# Patient Record
Sex: Male | Born: 1970 | Race: White | Hispanic: No | Marital: Single | Smoking: Former smoker
Health system: Southern US, Community
[De-identification: ages and names within clinical notes are randomized; demographics above are authoritative.]

## PROBLEM LIST (undated history)

## (undated) DIAGNOSIS — K219 Gastro-esophageal reflux disease without esophagitis: Secondary | ICD-10-CM

## (undated) HISTORY — PX: KNEE SURGERY: SHX244

## (undated) HISTORY — PX: THROAT SURGERY: SHX803

---

## 2005-11-02 ENCOUNTER — Emergency Department: Payer: Self-pay | Admitting: Emergency Medicine

## 2006-09-10 ENCOUNTER — Emergency Department: Payer: Self-pay | Admitting: Emergency Medicine

## 2006-11-02 ENCOUNTER — Emergency Department: Payer: Self-pay | Admitting: Emergency Medicine

## 2010-12-25 ENCOUNTER — Emergency Department (HOSPITAL_COMMUNITY)
Admission: EM | Admit: 2010-12-25 | Discharge: 2010-12-25 | Disposition: A | Discharge status: Home / Self Care | Payer: Self-pay | Attending: Emergency Medicine | Admitting: Emergency Medicine

## 2010-12-25 ENCOUNTER — Emergency Department (HOSPITAL_COMMUNITY): Payer: Self-pay

## 2010-12-25 DIAGNOSIS — M25529 Pain in unspecified elbow: Secondary | ICD-10-CM | POA: Insufficient documentation

## 2010-12-25 DIAGNOSIS — T07XXXA Unspecified multiple injuries, initial encounter: Secondary | ICD-10-CM | POA: Insufficient documentation

## 2010-12-25 DIAGNOSIS — W11XXXA Fall on and from ladder, initial encounter: Secondary | ICD-10-CM | POA: Insufficient documentation

## 2010-12-25 DIAGNOSIS — R109 Unspecified abdominal pain: Secondary | ICD-10-CM | POA: Insufficient documentation

## 2010-12-25 DIAGNOSIS — Y92009 Unspecified place in unspecified non-institutional (private) residence as the place of occurrence of the external cause: Secondary | ICD-10-CM | POA: Insufficient documentation

## 2010-12-25 DIAGNOSIS — R079 Chest pain, unspecified: Secondary | ICD-10-CM | POA: Insufficient documentation

## 2010-12-25 DIAGNOSIS — R042 Hemoptysis: Secondary | ICD-10-CM | POA: Insufficient documentation

## 2010-12-25 MED ORDER — IOHEXOL 300 MG/ML  SOLN
100.0000 mL | Freq: Once | INTRAMUSCULAR | Status: AC | PRN
  Administered 2010-12-25: 100 mL via INTRAVENOUS

## 2012-12-23 ENCOUNTER — Encounter (HOSPITAL_COMMUNITY): Payer: Self-pay | Admitting: Emergency Medicine

## 2012-12-23 ENCOUNTER — Emergency Department (HOSPITAL_COMMUNITY)
Admission: EM | Admit: 2012-12-23 | Discharge: 2012-12-23 | Disposition: A | Discharge status: Home / Self Care | Payer: Self-pay | Attending: Emergency Medicine | Admitting: Emergency Medicine

## 2012-12-23 ENCOUNTER — Emergency Department (HOSPITAL_COMMUNITY): Payer: Self-pay

## 2012-12-23 DIAGNOSIS — Y9389 Activity, other specified: Secondary | ICD-10-CM | POA: Insufficient documentation

## 2012-12-23 DIAGNOSIS — Y929 Unspecified place or not applicable: Secondary | ICD-10-CM | POA: Insufficient documentation

## 2012-12-23 DIAGNOSIS — W1789XA Other fall from one level to another, initial encounter: Secondary | ICD-10-CM | POA: Insufficient documentation

## 2012-12-23 DIAGNOSIS — F17200 Nicotine dependence, unspecified, uncomplicated: Secondary | ICD-10-CM | POA: Insufficient documentation

## 2012-12-23 DIAGNOSIS — S93409A Sprain of unspecified ligament of unspecified ankle, initial encounter: Secondary | ICD-10-CM | POA: Insufficient documentation

## 2012-12-23 DIAGNOSIS — Z79899 Other long term (current) drug therapy: Secondary | ICD-10-CM | POA: Insufficient documentation

## 2012-12-23 DIAGNOSIS — S93402A Sprain of unspecified ligament of left ankle, initial encounter: Secondary | ICD-10-CM

## 2012-12-23 MED ORDER — OXYCODONE-ACETAMINOPHEN 5-325 MG PO TABS
1.0000 | ORAL_TABLET | Freq: Once | ORAL | Status: AC
  Administered 2012-12-23: 1 via ORAL
  Filled 2012-12-23: qty 1

## 2012-12-23 MED ORDER — OXYCODONE-ACETAMINOPHEN 5-325 MG PO TABS: 1.0000 | ORAL_TABLET | ORAL | Status: DC | PRN

## 2012-12-23 NOTE — ED Provider Notes (Signed)
  CSN: [id number]     Arrival date & time 12/23/12  1120 History     First MD Initiated Contact with Patient 12/23/12 1134     Chief Complaint  Patient presents with  . Ankle Injury   (Consider location/radiation/quality/duration/timing/severity/associated sxs/prior Treatment) The history is provided by the patient.   Patient presents to the ED for left ankle pain since this morning. Patient states he was trying to install a door which was too large for the door frame. He tried to force the door and lost his balance, causing him to fall and twist his left ankle. Denies any head trauma or loss of consciousness. Now has pain and swelling to the lateral aspect of left ankle. Endorses some intermittent paresthesias of left foot.  Has taken OTC pain meds without significant relief.  No prior ankle injury.  History reviewed. No pertinent past medical history. History reviewed. No pertinent past surgical history. No family history on file. History  Substance Use Topics  . Smoking status: Current Every Day Smoker  . Smokeless tobacco: Not on file  . Alcohol Use: Yes    Review of Systems  Musculoskeletal: Positive for arthralgias.  All other systems reviewed and are negative.    Allergies  Review of patient's allergies indicates no known allergies.  Home Medications   Current Outpatient Rx  Name  Route  Sig  Dispense  Refill  . esomeprazole (NEXIUM) 20 MG capsule   Oral   Take 20 mg by mouth daily before breakfast.          BP 134/84  Pulse 80  Temp(Src) 97.9 F (36.6 C) (Oral)  Resp 22  SpO2 98% Physical Exam  Nursing note and vitals reviewed. Constitutional: He is oriented to person, place, and time. He appears well-developed and well-nourished.  HENT:  Head: Normocephalic and atraumatic.  Eyes: Conjunctivae and EOM are normal.  Neck: Normal range of motion. Neck supple.  Cardiovascular: Normal rate, regular rhythm and normal heart sounds.   Pulmonary/Chest: Effort  normal and breath sounds normal.  Musculoskeletal:       Left ankle: He exhibits decreased range of motion, swelling and ecchymosis. He exhibits no deformity, no laceration and normal pulse. Tenderness. Lateral malleolus tenderness found. Achilles tendon normal.       Feet:  Swelling, bruising, and TTP of lateral malleolus, some abrasion noted without active bleeding, strong distal pulse, sensation intact  Neurological: He is alert and oriented to person, place, and time.  Skin: Skin is warm and dry.  Psychiatric: He has a normal mood and affect.    ED Course   Procedures (including critical care time)  Labs Reviewed - No data to display Dg Ankle Complete Left  12/23/2012   *RADIOLOGY REPORT*  Clinical Data: Larey Seat, pain laterally  LEFT ANKLE COMPLETE - 3+ VIEW  Comparison: None  Findings: No fracture or dislocation.  Mild lateral soft tissue swelling.  Mortise intact.  Small joint effusion.  IMPRESSION: Sprain   Original Report Authenticated By: Esperanza Heir, M.D.   1. Ankle sprain, left, initial encounter     MDM   X-ray negative for acute fracture or dislocation-- Likely sprain. Ankle splint and crutches given. Rx Percocet. Continue following RICE routine at home for added relief.  Follow-up with orthopedics, Dr. Ophelia Charter.  Discussed plan with patient, he agreed. Return precautions advised.   Garlon Hatchet, PA-C 12/23/12 1346

## 2012-12-23 NOTE — ED Provider Notes (Signed)
Medical screening examination/treatment/procedure(s) were performed by non-physician practitioner and as supervising physician I was immediately available for consultation/collaboration.   Gwyneth Sprout, MD 12/23/12 2147

## 2012-12-23 NOTE — Progress Notes (Signed)
Orthopedic Tech Progress Note Patient Details:  Ralph Murphy [birth year] [id number]  Ortho Devices Type of Ortho Device: Ankle Air splint;Crutches Ortho Device/Splint Location: left ankle Ortho Device/Splint Interventions: Application   Hyun Reali 12/23/2012, 1:26 PM

## 2012-12-23 NOTE — ED Notes (Signed)
Turned left ankle while trying to install a door this am. Swelling and abrasion to left ankle.

## 2012-12-23 NOTE — ED Notes (Signed)
Patient transported to X-ray 

## 2012-12-23 NOTE — ED Notes (Signed)
Ice pack and elevation applied to left ankle. 

## 2013-01-07 ENCOUNTER — Ambulatory Visit: Payer: Self-pay

## 2018-02-11 ENCOUNTER — Other Ambulatory Visit: Payer: Self-pay

## 2018-02-11 ENCOUNTER — Emergency Department
Admission: EM | Admit: 2018-02-11 | Discharge: 2018-02-11 | Disposition: A | Discharge status: Home / Self Care | Payer: Self-pay | Attending: Emergency Medicine | Admitting: Emergency Medicine

## 2018-02-11 ENCOUNTER — Emergency Department: Payer: Self-pay

## 2018-02-11 ENCOUNTER — Encounter: Payer: Self-pay | Admitting: Emergency Medicine

## 2018-02-11 DIAGNOSIS — F17210 Nicotine dependence, cigarettes, uncomplicated: Secondary | ICD-10-CM | POA: Insufficient documentation

## 2018-02-11 DIAGNOSIS — R0602 Shortness of breath: Secondary | ICD-10-CM | POA: Insufficient documentation

## 2018-02-11 DIAGNOSIS — F1210 Cannabis abuse, uncomplicated: Secondary | ICD-10-CM | POA: Insufficient documentation

## 2018-02-11 DIAGNOSIS — R079 Chest pain, unspecified: Secondary | ICD-10-CM | POA: Insufficient documentation

## 2018-02-11 DIAGNOSIS — R61 Generalized hyperhidrosis: Secondary | ICD-10-CM | POA: Insufficient documentation

## 2018-02-11 HISTORY — DX: Gastro-esophageal reflux disease without esophagitis: K21.9

## 2018-02-11 LAB — BASIC METABOLIC PANEL
Anion gap: 9 (ref 5–15)
BUN: 15 mg/dL (ref 6–20)
CALCIUM: 8.8 mg/dL — AB (ref 8.9–10.3)
CO2: 26 mmol/L (ref 22–32)
CREATININE: 0.97 mg/dL (ref 0.61–1.24)
Chloride: 104 mmol/L (ref 98–111)
GFR calc Af Amer: >60 mL/min (ref >60)
GLUCOSE: 144 mg/dL — AB (ref 70–99)
Potassium: 3.8 mmol/L (ref 3.5–5.1)
Sodium: 139 mmol/L (ref 135–145)

## 2018-02-11 LAB — CBC
HCT: 44.9 % (ref 40.0–52.0)
Hemoglobin: 15.2 g/dL (ref 13.0–18.0)
MCH: 32.1 pg (ref 26.0–34.0)
MCHC: 34 g/dL (ref 32.0–36.0)
MCV: 94.5 fL (ref 80.0–100.0)
Platelets: 224 10*3/uL (ref 150–440)
RBC: 4.75 MIL/uL (ref 4.40–5.90)
RDW: 13.8 % (ref 11.5–14.5)
WBC: 7.6 10*3/uL (ref 3.8–10.6)

## 2018-02-11 LAB — TROPONIN I

## 2018-02-11 NOTE — ED Notes (Signed)
Pt discharged home after verbalizing understanding of discharge instructions; nad noted. 

## 2018-02-11 NOTE — ED Triage Notes (Signed)
Patient reports left-sided chest pain that started at 1030 this morning. Reports SOB and nausea during episode. Reports improvement of pain at this time. Patient denies history of similar episodes. Denies cardiac history.

## 2018-02-11 NOTE — Discharge Instructions (Signed)
Please establish a primary care physician who can do your normal health maintenance.  Please make a follow-up appointment with the cardiologist for further evaluation of your chest pain.  Return to the emergency department if you develop severe pain, lightheadedness or fainting, shortness of breath, palpitations, or any other symptoms concerning to you.

## 2018-02-11 NOTE — ED Provider Notes (Signed)
[facility] Emergency Department Provider Note  ____________________________________________  Time seen: Approximately 2:37 PM  I have reviewed the triage vital signs and the nursing notes.   HISTORY  Chief Complaint Chest Pain    HPI Ralph Murphy is a 47 y.o. male, with a history of GERD, presenting for chest pain.  The patient reports that he was standing at 10:30 AM today when he had 2 episodes of sharp chest pain on the left side that lasted several seconds each and resolve spontaneously.  These did not radiate, and he had no associated palpitations, lightheadedness or syncope.  He did have diaphoresis with nausea, and mild shortness of breath.  He has never experienced pains like this before but states that last week his chest "did not feel right."  He is unable to further characterize any symptoms but states that he has not been having any exertional chest pain.  He denies any lower extremity swelling or calf pain.  At this time, the patient remains symptom-free.  SH: Positive tobacco abuse FH: Does not know his mother; no history of early CAD in his father side.  Past Medical History:  Diagnosis Date  . GERD (gastroesophageal reflux disease)     There are no active problems to display for this patient.   Past Surgical History:  Procedure Laterality Date  . KNEE SURGERY    . THROAT SURGERY      Current Outpatient Rx  . Order #: [id number] Class: Historical Med  . Order #: [id number] Class: Print    Allergies Patient has no known allergies.  No family history on file.  Social History Social History   Tobacco Use  . Smoking status: Current Every Day Smoker    Packs/day: 1.00  . Smokeless tobacco: Never Used  Substance Use Topics  . Alcohol use: Not Currently    Comment: stopped 10/2017  . Drug use: Yes    Types: Marijuana    Comment: daily    Review of Systems Constitutional: No fever/chills.  No lightheadedness or syncope.  Positive  diaphoresis. Eyes: No visual changes. ENT: No sore throat. No congestion or rhinorrhea. Cardiovascular: Positive sharp left-sided chest pain. Denies palpitations. Respiratory: Positive shortness of breath.  No cough. Gastrointestinal: No abdominal pain.  No nausea, no vomiting.  No diarrhea.  No constipation. Genitourinary: Negative for dysuria. Musculoskeletal: Negative for back pain.  No lower extremity swelling or calf pain. Skin: Negative for rash. Neurological: Negative for headaches. No focal numbness, tingling or weakness.     ____________________________________________   PHYSICAL EXAM:  VITAL SIGNS: ED Triage Vitals  Enc Vitals Group     BP 02/11/18 1153 (!) 149/87     Pulse Rate 02/11/18 1153 72     Resp 02/11/18 1153 18     Temp 02/11/18 1153 98.4 F (36.9 C)     Temp Source 02/11/18 1153 Oral     SpO2 02/11/18 1153 97 %     Weight 02/11/18 1150 222 lb (100.7 kg)     Height 02/11/18 1150 6' (1.829 m)     Head Circumference --      Peak Flow --      Pain Score 02/11/18 1149 5     Pain Loc --      Pain Edu? --      Excl. in GC? --     Constitutional: Alert and oriented. Well appearing and in no acute distress. Answers questions appropriately. Eyes: Conjunctivae are normal.  EOMI. No scleral icterus. Head:  Atraumatic. Nose: No congestion/rhinnorhea. Mouth/Throat: Mucous membranes are moist.  Neck: No stridor.  Supple.  No JVD, no meningismus. Cardiovascular: Normal rate, regular rhythm. No murmurs, rubs or gallops.  Respiratory: Normal respiratory effort.  No accessory muscle use or retractions. Lungs CTAB.  No wheezes, rales or ronchi. Gastrointestinal: Soft, nontender and nondistended.  No guarding or rebound.  No peritoneal signs. Musculoskeletal: No LE edema.  Tenderness to palpation or palpable cords in the calves; negative Homans sign peer Neurologic:  A&Ox3.  Speech is clear.  Face and smile are symmetric.  EOMI.  Moves all extremities well. Skin:  Skin  is warm, dry and intact. No rash noted. Psychiatric: Mood and affect are normal. Speech and behavior are normal.  Normal judgement.  ____________________________________________   LABS (all labs ordered are listed, but only abnormal results are displayed)  Labs Reviewed  BASIC METABOLIC PANEL - Abnormal; Notable for the following components:      Result Value   Glucose, Bld 144 (*)    Calcium 8.8 (*)    All other components within normal limits  CBC  TROPONIN I  TROPONIN I   ____________________________________________  EKG  ED ECG REPORT I, Anne-Caroline Sharma Covert, the attending physician, personally viewed and interpreted this ECG.   Date: 02/11/2018  EKG Time: 1141  Rate: 65  Rhythm: normal sinus rhythm  Axis: leftward  Intervals:none  ST&T Change: No STEMI  ____________________________________________  RADIOLOGY  Dg Chest 2 View  Result Date: 02/11/2018 CLINICAL DATA:  Left-sided chest pain began at 10:30 this morning associated with shortness of breath and nausea. Symptoms have improved. Current smoker. EXAM: CHEST - 2 VIEW COMPARISON:  Chest x-ray of May 16, 2014 FINDINGS: The lungs are well-expanded. There is no focal infiltrate. There is no pleural effusion. The heart and mediastinal structures are normal. The trachea is midline. The bony thorax exhibits no acute abnormality. There is an old fracture of the posterior aspect of the right ninth rib. IMPRESSION: There is no active cardiopulmonary disease. Electronically Signed   By: David  [place] M.D.   On: 02/11/2018 12:20    ____________________________________________   PROCEDURES  Procedure(s) performed: None  Procedures  Critical Care performed: No ____________________________________________   INITIAL IMPRESSION / ASSESSMENT AND PLAN / ED COURSE  Pertinent labs & imaging results that were available during my care of the patient were reviewed by me and considered in my medical decision making (see  chart for details).  47 y.o. male, with a history of GERD, presenting with left-sided sharp chest pains associated with diaphoresis, shortness of breath and nausea without vomiting.  These chest pain episodes are atypical and were very brief in nature.  The patient has not been having any exertional chest pain and has no known history of hypertension, hyperlipidemia, diabetes, or prior coronary artery disease, although he does state he does not go to the doctor frequently.  The patient's EKG does not show any ischemic changes and his troponin is negative x1 although it was done only 1.5 hours after the initial event.  We will get a second troponin.  The patient's laboratory studies are otherwise reassuring with normal electrolytes, normal blood counts, and a chest x-ray that does not show any acute cardiopulmonary process.  Other possible etiologies include esophageal spasm.  PE or aortic pathology are considered but unlikely.  At this time, we will await the second troponin and if it is negative the patient will be discharged home.  I have I have already spoken to the  patient and his wife about undergoing a risk stratification study, as the patient has never had one, within the next couple of days.  ____________________________________________  FINAL CLINICAL IMPRESSION(S) / ED DIAGNOSES  Final diagnoses:  Chest pain, unspecified type  Shortness of breath  Diaphoresis         NEW MEDICATIONS STARTED DURING THIS VISIT:  New Prescriptions   No medications on file      Rockne Menghini, MD 02/11/18 1447

## 2018-02-13 ENCOUNTER — Other Ambulatory Visit: Payer: Self-pay | Admitting: Cardiology

## 2018-02-13 ENCOUNTER — Ambulatory Visit
Admission: RE | Admit: 2018-02-13 | Discharge: 2018-02-13 | Disposition: A | Discharge status: Home / Self Care | Payer: Self-pay | Source: Ambulatory Visit | Attending: Cardiology | Admitting: Cardiology

## 2018-02-13 DIAGNOSIS — R0602 Shortness of breath: Secondary | ICD-10-CM | POA: Insufficient documentation

## 2018-02-13 DIAGNOSIS — M79605 Pain in left leg: Secondary | ICD-10-CM

## 2018-02-13 DIAGNOSIS — M79662 Pain in left lower leg: Secondary | ICD-10-CM | POA: Insufficient documentation

## 2018-02-13 DIAGNOSIS — R079 Chest pain, unspecified: Secondary | ICD-10-CM | POA: Insufficient documentation

## 2021-01-04 ENCOUNTER — Emergency Department (HOSPITAL_COMMUNITY)
Admission: EM | Admit: 2021-01-04 | Discharge: 2021-01-04 | Disposition: A | Discharge status: Home / Self Care | Payer: Self-pay | Attending: Emergency Medicine | Admitting: Emergency Medicine

## 2021-01-04 ENCOUNTER — Emergency Department (HOSPITAL_COMMUNITY): Payer: Self-pay

## 2021-01-04 ENCOUNTER — Other Ambulatory Visit: Payer: Self-pay

## 2021-01-04 ENCOUNTER — Encounter (HOSPITAL_COMMUNITY): Payer: Self-pay | Admitting: Emergency Medicine

## 2021-01-04 DIAGNOSIS — Z23 Encounter for immunization: Secondary | ICD-10-CM | POA: Insufficient documentation

## 2021-01-04 DIAGNOSIS — S61211A Laceration without foreign body of left index finger without damage to nail, initial encounter: Secondary | ICD-10-CM | POA: Insufficient documentation

## 2021-01-04 DIAGNOSIS — W312XXA Contact with powered woodworking and forming machines, initial encounter: Secondary | ICD-10-CM | POA: Insufficient documentation

## 2021-01-04 DIAGNOSIS — F17210 Nicotine dependence, cigarettes, uncomplicated: Secondary | ICD-10-CM | POA: Insufficient documentation

## 2021-01-04 MED ORDER — LIDOCAINE HCL (PF) 1 % IJ SOLN
5.0000 mL | Freq: Once | INTRAMUSCULAR | Status: AC
  Administered 2021-01-04: 5 mL
  Filled 2021-01-04: qty 30

## 2021-01-04 MED ORDER — CEPHALEXIN 500 MG PO CAPS: 500.0000 mg | ORAL_CAPSULE | Freq: Two times a day (BID) | ORAL | 0 refills | Status: AC

## 2021-01-04 MED ORDER — ACETAMINOPHEN 500 MG PO TABS: 1000.0000 mg | ORAL_TABLET | Freq: Once | ORAL | Status: DC

## 2021-01-04 MED ORDER — TETANUS-DIPHTH-ACELL PERTUSSIS 5-2.5-18.5 LF-MCG/0.5 IM SUSY
0.5000 mL | PREFILLED_SYRINGE | Freq: Once | INTRAMUSCULAR | Status: AC
  Administered 2021-01-04: 0.5 mL via INTRAMUSCULAR
  Filled 2021-01-04: qty 0.5

## 2021-01-04 MED ORDER — OXYCODONE-ACETAMINOPHEN 5-325 MG PO TABS
1.0000 | ORAL_TABLET | Freq: Once | ORAL | Status: AC
Start: 2021-01-04 — End: 2021-01-04
  Administered 2021-01-04: 1 via ORAL
  Filled 2021-01-04: qty 1

## 2021-01-04 MED ORDER — POVIDONE-IODINE 10 % EX SOLN
CUTANEOUS | Status: AC
  Filled 2021-01-04: qty 15

## 2021-01-04 MED ORDER — OXYCODONE-ACETAMINOPHEN 5-325 MG PO TABS: 1.0000 | ORAL_TABLET | Freq: Three times a day (TID) | ORAL | 0 refills | Status: AC | PRN

## 2021-01-04 NOTE — Discharge Instructions (Addendum)
You have a laceration on your left index finger, you have received 10 sutures, please leave the area alone, for the first 24 hours after that I like you to rinse out the wound and change out the dressings 2-3 times daily.  I have started you on antibiotics please take as prescribed. I have given you a short course of narcotics please take as prescribed.  This medication can make you drowsy do not consume alcohol or operate heavy machinery when taking this medication.  This medication is Tylenol in it do not take Tylenol and take this medication.   Please come back to this department, urgent care, your PCP in the next 7 to 10 days to have your sutures removed.  Come back to the emergency department if you develop chest pain, shortness of breath, severe abdominal pain, uncontrolled nausea, vomiting, diarrhea.

## 2021-01-04 NOTE — ED Triage Notes (Signed)
Pt has left index finger laceration from a saw; bleeding controlled at this time

## 2021-01-04 NOTE — ED Notes (Signed)
Dressing applied. 

## 2021-01-04 NOTE — ED Provider Notes (Signed)
[facility] EMERGENCY DEPARTMENT Provider Note   CSN: [id number] Arrival date & time: 01/04/21  0962     History Chief Complaint  Patient presents with   Laceration    Ralph Murphy is a 50 y.o. male.  HPI  Patient with no significant medical history presents to the emergency department with chief complaint of left index finger laceration.  Patient states this happened a few hours ago, states he was outside cutting a piece of wood unfortunately the wood kicked back causing his hand to hit the edge of the saw.  He states he sustained a laceration on the dorsum and lateral aspect of the left index finger.  He states he was able to control the bleeding with direct pressure, he denies paresthesias or weakness in his finger, is able to move it without difficulty, he is not immunocompromise, is not on a blood thinner, does not remember his last tetanus shot.  He has no other complaints this time.  Does not endorse fevers, chills, chest pain, shortness of breath, abdominal pain.  Past Medical History:  Diagnosis Date   GERD (gastroesophageal reflux disease)     There are no problems to display for this patient.   Past Surgical History:  Procedure Laterality Date   KNEE SURGERY     THROAT SURGERY         History reviewed. No pertinent family history.  Social History   Tobacco Use   Smoking status: Every Day    Packs/day: 1.00    Types: Cigarettes   Smokeless tobacco: Never  Substance Use Topics   Alcohol use: Not Currently    Comment: stopped 10/2017   Drug use: Yes    Types: Marijuana    Comment: daily    Home Medications Prior to Admission medications   Medication Sig Start Date End Date Taking? Authorizing Provider  cephALEXin (KEFLEX) 500 MG capsule Take 1 capsule (500 mg total) by mouth 2 (two) times daily for 7 days. 01/04/21 01/11/21 Yes Carroll Sage, PA-C  oxyCODONE-acetaminophen (PERCOCET/ROXICET) 5-325 MG tablet Take 1 tablet by mouth every 8 (eight) hours as  needed for up to 3 days for severe pain. 01/04/21 01/07/21 Yes Carroll Sage, PA-C  esomeprazole (NEXIUM) 20 MG capsule Take 20 mg by mouth daily before breakfast.    [provider]    Allergies    Patient has no known allergies.  Review of Systems   Review of Systems  Constitutional:  Negative for chills and fever.  HENT:  Negative for congestion.   Respiratory:  Negative for shortness of breath.   Cardiovascular:  Negative for chest pain.  Gastrointestinal:  Negative for abdominal pain.  Musculoskeletal:  Negative for back pain.  Skin:  Positive for wound. Negative for rash.       Laceration to his left index finger.  Neurological:  Negative for dizziness.  Hematological:  Does not bruise/bleed easily.   Physical Exam Updated Vital Signs BP 132/78 (BP Location: Right Arm)   Pulse 70   Temp 98 F (36.7 C) (Oral)   Resp 18   Ht 6\' 1"  (1.854 m)   Wt 95.7 kg   SpO2 100%   BMI 27.84 kg/m   Physical Exam Vitals and nursing note reviewed.  Constitutional:      General: He is not in acute distress.    Appearance: He is not ill-appearing.  HENT:     Head: Normocephalic and atraumatic.     Nose: No congestion.  Eyes:  Conjunctiva/sclera: Conjunctivae normal.  Cardiovascular:     Rate and Rhythm: Normal rate and regular rhythm.     Pulses: Normal pulses.  Pulmonary:     Effort: Pulmonary effort is normal.  Musculoskeletal:     Comments: Left hand was visualized has a noted 6 cm laceration on the dorsum lateral aspect of the left index finger, hemodynamically stable, no surrounding erythema or edema, there is no noted ligament or tendon damage, no foreign bodies noted, he has full range of motion in all joints of his fingers, neurovascular fully intact.  Please see picture for full detail.  Skin:    General: Skin is warm and dry.  Neurological:     Mental Status: He is alert.  Psychiatric:        Mood and Affect: Mood normal.       ED Results /  Procedures / Treatments   Labs (all labs ordered are listed, but only abnormal results are displayed) Labs Reviewed - No data to display  EKG None  Radiology DG Finger Index Left  Result Date: 01/04/2021 CLINICAL DATA:  Index finger laceration. EXAM: LEFT INDEX FINGER 2+V COMPARISON:  None. FINDINGS: Cutaneous laceration of the index finger noted without foreign body or underlying fracture. No acute bony findings. IMPRESSION: 1. Cutaneous laceration of the left index finger. Electronically Signed   By: Gaylyn Rong M.D.   On: 01/04/2021 11:02    Procedures .Marland KitchenLaceration Repair  Date/Time: 01/04/2021 12:40 PM Performed by: Carroll Sage, PA-C Authorized by: Carroll Sage, PA-C   Consent:    Consent obtained:  Verbal   Consent given by:  Patient   Risks discussed:  Infection, pain, retained foreign body, need for additional repair, poor cosmetic result, tendon damage, vascular damage, poor wound healing and nerve damage   Alternatives discussed:  No treatment, delayed treatment, observation and referral Universal protocol:    Patient identity confirmed:  Verbally with patient Anesthesia:    Anesthesia method:  Nerve block   Block needle gauge:  24 G   Block anesthetic:  Lidocaine 1% w/o epi   Block injection procedure:  Introduced needle   Block outcome:  Anesthesia achieved Laceration details:    Location:  Finger   Finger location:  L index finger   Length (cm):  6   Depth (mm):  2 Pre-procedure details:    Preparation:  Patient was prepped and draped in usual sterile fashion and imaging obtained to evaluate for foreign bodies Exploration:    Limited defect created (wound extended): no     Hemostasis achieved with:  Direct pressure   Imaging obtained: x-ray     Imaging outcome: foreign body not noted     Wound exploration: wound explored through full range of motion     Contaminated: no   Treatment:    Area cleansed with:  Betadine   Amount of cleaning:   Standard   Visualized foreign bodies/material removed: no     Debridement:  None Skin repair:    Repair method:  Sutures   Suture size:  4-0   Suture material:  Prolene   Suture technique:  Simple interrupted   Number of sutures:  10 Approximation:    Approximation:  Close Post-procedure details:    Dressing:  Non-adherent dressing   Procedure completion:  Tolerated well, no immediate complications Comments:     After the procedure patient motor, sensation, strength were all intact.area was soft to the touch with good capillary refill.  No signs  of infection were noted, no rash, no ligament or tendon damage present.   Medications Ordered in ED Medications  povidone-iodine (BETADINE) 10 % external solution (has no administration in time range)  lidocaine (PF) (XYLOCAINE) 1 % injection 5 mL (5 mLs Infiltration Given 01/04/21 1133)  Tdap (BOOSTRIX) injection 0.5 mL (0.5 mLs Intramuscular Given 01/04/21 1134)  oxyCODONE-acetaminophen (PERCOCET/ROXICET) 5-325 MG per tablet 1 tablet (1 tablet Oral Given 01/04/21 1132)    ED Course  I have reviewed the triage vital signs and the nursing notes.  Pertinent labs & imaging results that were available during my care of the patient were reviewed by me and considered in my medical decision making (see chart for details).    MDM Rules/Calculators/A&P                          Initial impression-patient presents with a laceration to his left index finger.  He is alert, does not appear in acute stress, vital signs reassuring.  Will obtain imaging, update patient's tetanus shot, recommend suturing for improved wound healing.  Work-up-imaging of left index finger is negative for acute findings.  Reassessment Will recommend suturing to decrease infection risk and to assist with the healing process.  Patient was agreeable with this and tolerated the procedure well.  He received 10 sutures.  Neurovascular was fully intact after the procedure.  Rule  out-Low suspicion for fracture or dislocation as x-ray does not reveal any acute findings. low suspicion for ligament or tendon damage as area was palpated no gross defects noted, he had full range of motion in his left index finger.  Low suspicion for compartment syndrome as area was palpated it was soft to the touch, neurovascular fully intact before and after the procedure  Plan-  Laceration to left index finger-patient received 10 sutures, due to nature of wound will start him on antibiotics, he is up-to-date on his tetanus shot, have him come back 10 days for suture removal.  Vital signs have remained stable, no indication for hospital admission.    Patient given at home care as well strict return precautions.  Patient verbalized that they understood agreed to said plan.  Final Clinical Impression(s) / ED Diagnoses Final diagnoses:  Laceration of left index finger without foreign body without damage to nail, initial encounter    Rx / DC Orders ED Discharge Orders          Ordered    oxyCODONE-acetaminophen (PERCOCET/ROXICET) 5-325 MG tablet  Every 8 hours PRN        01/04/21 1232    cephALEXin (KEFLEX) 500 MG capsule  2 times daily        01/04/21 1232             Carroll Sage, PA-C 01/04/21 1247    Pollyann Savoy, MD 01/04/21 [phone]

## 2021-03-08 ENCOUNTER — Other Ambulatory Visit: Payer: Self-pay

## 2021-03-08 ENCOUNTER — Emergency Department
Admission: EM | Admit: 2021-03-08 | Discharge: 2021-03-08 | Disposition: A | Discharge status: Home / Self Care | Payer: Self-pay | Attending: Emergency Medicine | Admitting: Emergency Medicine

## 2021-03-08 ENCOUNTER — Emergency Department: Payer: Self-pay

## 2021-03-08 DIAGNOSIS — Y990 Civilian activity done for income or pay: Secondary | ICD-10-CM | POA: Insufficient documentation

## 2021-03-08 DIAGNOSIS — F17210 Nicotine dependence, cigarettes, uncomplicated: Secondary | ICD-10-CM | POA: Insufficient documentation

## 2021-03-08 DIAGNOSIS — S60451A Superficial foreign body of left index finger, initial encounter: Secondary | ICD-10-CM | POA: Insufficient documentation

## 2021-03-08 DIAGNOSIS — S62641B Nondisplaced fracture of proximal phalanx of left index finger, initial encounter for open fracture: Secondary | ICD-10-CM | POA: Insufficient documentation

## 2021-03-08 DIAGNOSIS — Y9269 Other specified industrial and construction area as the place of occurrence of the external cause: Secondary | ICD-10-CM | POA: Insufficient documentation

## 2021-03-08 DIAGNOSIS — W450XXA Nail entering through skin, initial encounter: Secondary | ICD-10-CM | POA: Insufficient documentation

## 2021-03-08 MED ORDER — CEPHALEXIN 500 MG PO CAPS: 500.0000 mg | ORAL_CAPSULE | Freq: Four times a day (QID) | ORAL | 0 refills | Status: AC

## 2021-03-08 MED ORDER — NAPROXEN 250 MG PO TABS: 500.0000 mg | ORAL_TABLET | Freq: Two times a day (BID) | ORAL | 0 refills | Status: AC

## 2021-03-08 MED ORDER — OXYCODONE HCL 5 MG PO TABS: 5.0000 mg | ORAL_TABLET | ORAL | 0 refills | Status: AC | PRN

## 2021-03-08 MED ORDER — HYDROMORPHONE HCL 1 MG/ML IJ SOLN
1.0000 mg | Freq: Once | INTRAMUSCULAR | Status: AC
  Administered 2021-03-08: 1 mg via INTRAVENOUS
  Filled 2021-03-08: qty 1

## 2021-03-08 MED ORDER — CEPHALEXIN 500 MG PO CAPS: 500.0000 mg | ORAL_CAPSULE | Freq: Four times a day (QID) | ORAL | 0 refills | Status: DC

## 2021-03-08 MED ORDER — LIDOCAINE HCL (PF) 1 % IJ SOLN
5.0000 mL | Freq: Once | INTRAMUSCULAR | Status: AC
  Administered 2021-03-08: 5 mL
  Filled 2021-03-08: qty 5

## 2021-03-08 MED ORDER — CEFAZOLIN SODIUM-DEXTROSE 1-4 GM/50ML-% IV SOLN
1.0000 g | Freq: Once | INTRAVENOUS | Status: AC
  Administered 2021-03-08: 1 g via INTRAVENOUS
  Filled 2021-03-08: qty 50

## 2021-03-08 MED ORDER — SULFAMETHOXAZOLE-TRIMETHOPRIM 800-160 MG PO TABS: 1.0000 | ORAL_TABLET | Freq: Two times a day (BID) | ORAL | 0 refills | Status: AC

## 2021-03-08 NOTE — Discharge Instructions (Addendum)
You have a fracture of the finger where the nail was embedded.  This puts you at risk of infection.  Please take the 2 antibiotics twice daily for the next 10 days.  Please follow-up with orthopedics in about 1 week.  If you develop redness swelling or pus draining from the wound, please return to the emergency department immediately.

## 2021-03-08 NOTE — ED Notes (Signed)
Ortho at bedside.

## 2021-03-08 NOTE — ED Provider Notes (Signed)
[facility]  ____________________________________________   Event Date/Time   First MD Initiated Contact with Patient 03/08/21 0915     (approximate)  I have reviewed the triage vital signs and the nursing notes.   HISTORY  Chief Complaint Foreign Body   HPI Ralph Murphy is a 50 y.o. male who presents with a nail embedded in his left second digit.  Onset was just prior to arrival.  Patient works with [employer] and was using a nail gun when it accidentally went into the proximal left second digit.  He endorses numbness distal and inability to straighten the digit.  Tetanus was several months ago.         Past Medical History:  Diagnosis Date   GERD (gastroesophageal reflux disease)     There are no problems to display for this patient.   Past Surgical History:  Procedure Laterality Date   KNEE SURGERY     THROAT SURGERY      Prior to Admission medications   Medication Sig Start Date End Date Taking? Authorizing Provider  cephALEXin (KEFLEX) 500 MG capsule Take 1 capsule (500 mg total) by mouth 4 (four) times daily for 7 days. 03/08/21 03/15/21 Yes Georga Hacking, MD  esomeprazole (NEXIUM) 20 MG capsule Take 20 mg by mouth daily before breakfast.   Yes [provider]    Allergies Patient has no known allergies.  No family history on file.  Social History Social History   Tobacco Use   Smoking status: Every Day    Packs/day: 1.00    Types: Cigarettes   Smokeless tobacco: Never  Substance Use Topics   Alcohol use: Not Currently    Comment: stopped 10/2017   Drug use: Yes    Types: Marijuana    Comment: daily    Review of Systems   Review of Systems  Musculoskeletal:  Positive for arthralgias.  Skin:  Positive for wound.  Neurological:  Positive for numbness. Negative for weakness.  All other systems reviewed and are negative.  Physical Exam Updated Vital Signs BP (!) 145/83 (BP Location: Right Arm)   Pulse  65   Resp 16   Ht 6\' 1"  (1.854 m)   Wt 98 kg   SpO2 96%   BMI 28.50 kg/m   Physical Exam Vitals and nursing note reviewed.  Constitutional:      General: He is not in acute distress.    Appearance: Normal appearance.  HENT:     Head: Normocephalic and atraumatic.  Eyes:     General: No scleral icterus.    Conjunctiva/sclera: Conjunctivae normal.  Pulmonary:     Effort: Pulmonary effort is normal. No respiratory distress.     Breath sounds: Normal breath sounds. No wheezing.  Musculoskeletal:        General: No deformity or signs of injury.     Cervical back: Normal range of motion.     Comments: Metal nail embedded in the proximal aspect of the proximal phalanx of the left second digit, does not go through and through, there is subjective decrease sensation to the distal phalanx, good cap refill, 2+ radial pulse  Skin:    Coloration: Skin is not jaundiced or pale.  Neurological:     General: No focal deficit present.     Mental Status: He is alert and oriented to person, place, and time. Mental status is at baseline.  Psychiatric:        Mood and Affect: Mood normal.  Behavior: Behavior normal.     LABS (all labs ordered are listed, but only abnormal results are displayed)  Labs Reviewed - No data to display ____________________________________________  EKG  N/a ____________________________________________  RADIOLOGY I, Randol Kern, personally viewed and evaluated these images (plain radiographs) as part of my medical decision making, as well as reviewing the written report by the radiologist.  ED MD interpretation: I reviewed the x-ray of the hand which demonstrates the nail in the proximal phalanx with an associated nondisplaced oblique fracture of the proximal phalanx    ____________________________________________   PROCEDURES  Procedure(s) performed (including Critical Care):  .Nerve Block  Date/Time: 03/08/2021 12:45 PM Performed by:  Georga Hacking, MD Authorized by: Georga Hacking, MD   Consent:    Consent obtained:  Verbal   Risks discussed:  Swelling and pain Universal protocol:    Patient identity confirmed:  Verbally with patient Indications:    Indications:  Pain relief and procedural anesthesia Location:    Body area:  Upper extremity   Laterality:  Left Skin anesthesia:    Skin anesthesia method:  None Procedure details:    Block needle gauge:  24 G   Anesthetic injected:  Lidocaine 1% w/o epi   Steroid injected:  None   Additive injected:  None   Injection procedure:  Anatomic landmarks identified Post-procedure details:    Outcome:  Anesthesia achieved   Procedure completion:  Tolerated Comments:     L index finger digital block   ____________________________________________   INITIAL IMPRESSION / ASSESSMENT AND PLAN / ED COURSE     50 year old male presents with a nailbed in his finger.  His tetanus is up-to-date.  On exam the nail is embedded in the proximal phalanx of the left second digit, does not go through and through.  Digital block was performed and I attempted to remove the nail with gentle traction however was unsuccessful.  Discussed with orthopedics who will come evaluate the patient.  He was given a dose of Ancef.  Dr. Martha Clan was able to remove the nail at bedside.  This was washed out thoroughly and dressed.  Will discharge with 7 days of Keflex.  Orthopedic follow-up in 1 week.  We discussed return precautions for signs of infection.      ____________________________________________   FINAL CLINICAL IMPRESSION(S) / ED DIAGNOSES  Final diagnoses:  Foreign body of left index finger  Open nondisplaced fracture of proximal phalanx of left index finger, initial encounter     ED Discharge Orders          Ordered    cephALEXin (KEFLEX) 500 MG capsule  4 times daily        03/08/21 1243             Note:  This document was prepared using Dragon voice  recognition software and may include unintentional dictation errors.    Georga Hacking, MD 03/08/21 1246

## 2021-03-08 NOTE — ED Triage Notes (Signed)
Pt to ED with nail to left hand from nail gun. Pt diaphoretic, dizzy, pale.

## 2021-03-08 NOTE — Consult Note (Signed)
ORTHOPAEDIC CONSULTATION  REQUESTING PHYSICIAN: Georga Hacking, MD  Chief Complaint: Nail gun injury to left index finger  HPI: Ralph Murphy is a 50 y.o. male presents to the ER after nail gun injury to his left index finger this morning.  Patient accidentally shot himself in the proximal phalanx of the left index finger with a nail.  He drove himself to the emergency department.  Patient underwent a digital block with attempted removal of the nail by the emergency room attending, Dr. Sidney Ace.  Orthopedics was consulted for assistance with nail removal after a failed attempt.  Dr. Debbe Mounts note states that the patient had an inability to straighten the digit upon arrival and had paresthesias in the distal aspect of the left index finger.  Patient is up-to-date on his tetanus.  He has received IV antibiotics in the emergency department.  Patient has received a digital block.    Patient's wife is at the bedside.  Past Medical History:  Diagnosis Date   GERD (gastroesophageal reflux disease)    Past Surgical History:  Procedure Laterality Date   KNEE SURGERY     THROAT SURGERY     Social History   Socioeconomic History   Marital status: Single    Spouse name: Not on file   Number of children: Not on file   Years of education: Not on file   Highest education level: Not on file  Occupational History   Not on file  Tobacco Use   Smoking status: Every Day    Packs/day: 1.00    Types: Cigarettes   Smokeless tobacco: Never  Substance and Sexual Activity   Alcohol use: Not Currently    Comment: stopped 10/2017   Drug use: Yes    Types: Marijuana    Comment: daily   Sexual activity: Yes  Other Topics Concern   Not on file  Social History Narrative   Not on file   Social Determinants of Health   Financial Resource Strain: Not on file  Food Insecurity: Not on file  Transportation Needs: Not on file  Physical Activity: Not on file  Stress: Not on file  Social Connections: Not  on file   No family history on file. No Known Allergies Prior to Admission medications   Medication Sig Start Date End Date Taking? Authorizing Provider  esomeprazole (NEXIUM) 20 MG capsule Take 20 mg by mouth daily before breakfast.   Yes [provider]  sulfamethoxazole-trimethoprim (BACTRIM DS) 800-160 MG tablet Take 1 tablet by mouth 2 (two) times daily for 10 days. 03/08/21 03/18/21 Yes Georga Hacking, MD  cephALEXin (KEFLEX) 500 MG capsule Take 1 capsule (500 mg total) by mouth 4 (four) times daily for 10 days. 03/08/21 03/18/21  Georga Hacking, MD   DG Hand Complete Left  Result Date: 03/08/2021 CLINICAL DATA:  Nail gun to left hand EXAM: LEFT HAND - COMPLETE 3+ VIEW COMPARISON:  Left index finger radiographs 01/04/2021 FINDINGS: There is a nail which obliquely penetrates through the base of the index finger metacarpal. There is no evidence of protrusion into the MCP joint on the lateral projection. Lucency within the bone adjacent to the nail seen on the frontal projection is consistent with nondisplaced fracture. There is no displaced fracture fragment. There is no other fracture. There is no erosive change. The joint spaces are preserved. Alignment is normal. IMPRESSION: Nail obliquely penetrating the base of the index finger metacarpal without evidence of intra-articular extension as above. Electronically Signed   By:  Lesia Hausen M.D.   On: 03/08/2021 09:58    Positive ROS: All other systems have been reviewed and were otherwise negative with the exception of those mentioned in the HPI and as above.  Physical Exam: General: Alert, no acute distress  MUSCULOSKELETAL: Left index finger: Patient has a single puncture wound over the dorsal radial aspect of the left proximal phalanx.  Patient's index finger remains well-perfused.  Patient has limited range of motion with the nail in place.  Patient has no erythema or fluctuance.  There is no osseous deformity seen.   Patient sensation is diminished due to digital block.  Neurologic: Patient has penetration of the proximal phalanx near the base from a nail without barbs.  There is no penetration into the MP joint.  Patient has a nondisplaced spiral fracture of the shaft of the proximal phalanx.  Assessment: Nail penetration into proximal phalanx of left index finger  Plan: Patient had a digital block.  I used pliers from the OR to remove the nail.  Compression was applied for bleeding.  I copiously irrigated the patient's left puncture wound.  I applied a dry sterile dressing.  A splint was applied to the undersurface of the finger to protect the fracture.  Patient be discharged on Bactrim and Keflex and will follow up in our office in 7 to 10 days for wound check and repeat x-ray of his proximal phalanx fracture.    Juanell Fairly, MD    03/08/2021 1:33 PM

## 2022-01-06 IMAGING — DX DG FINGER INDEX 2+V*L*
3 series · 3 of 3 positions shown · non-contrast
Comparison: None.

CLINICAL DATA: Index finger laceration.

EXAM:
LEFT INDEX FINGER 2+V

[finger ap]
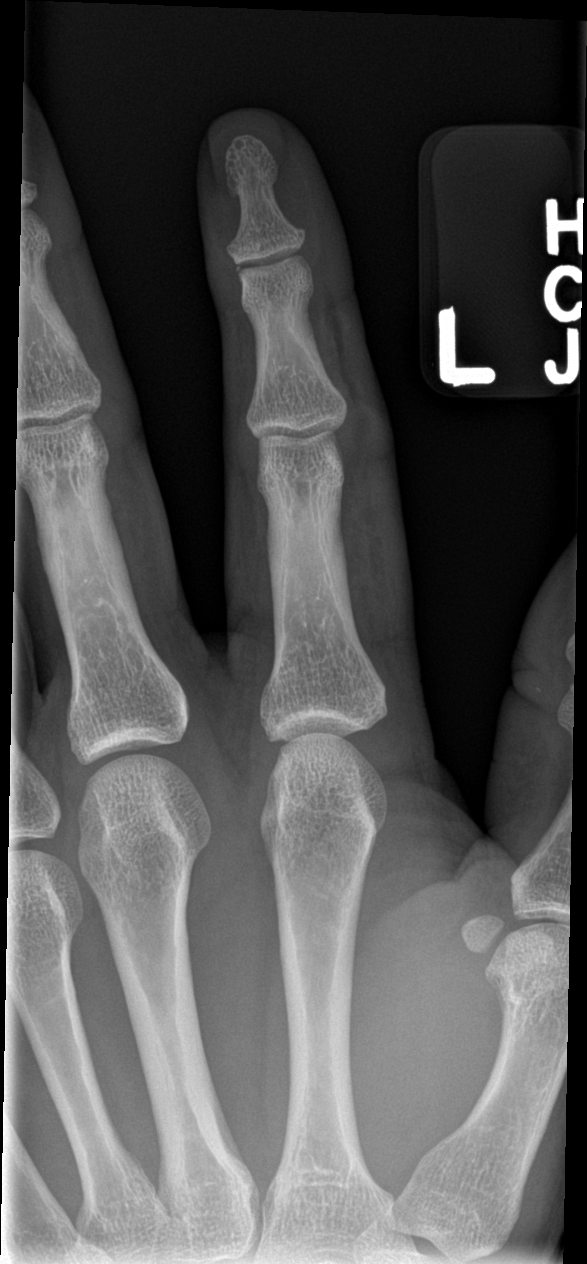

[finger obl]
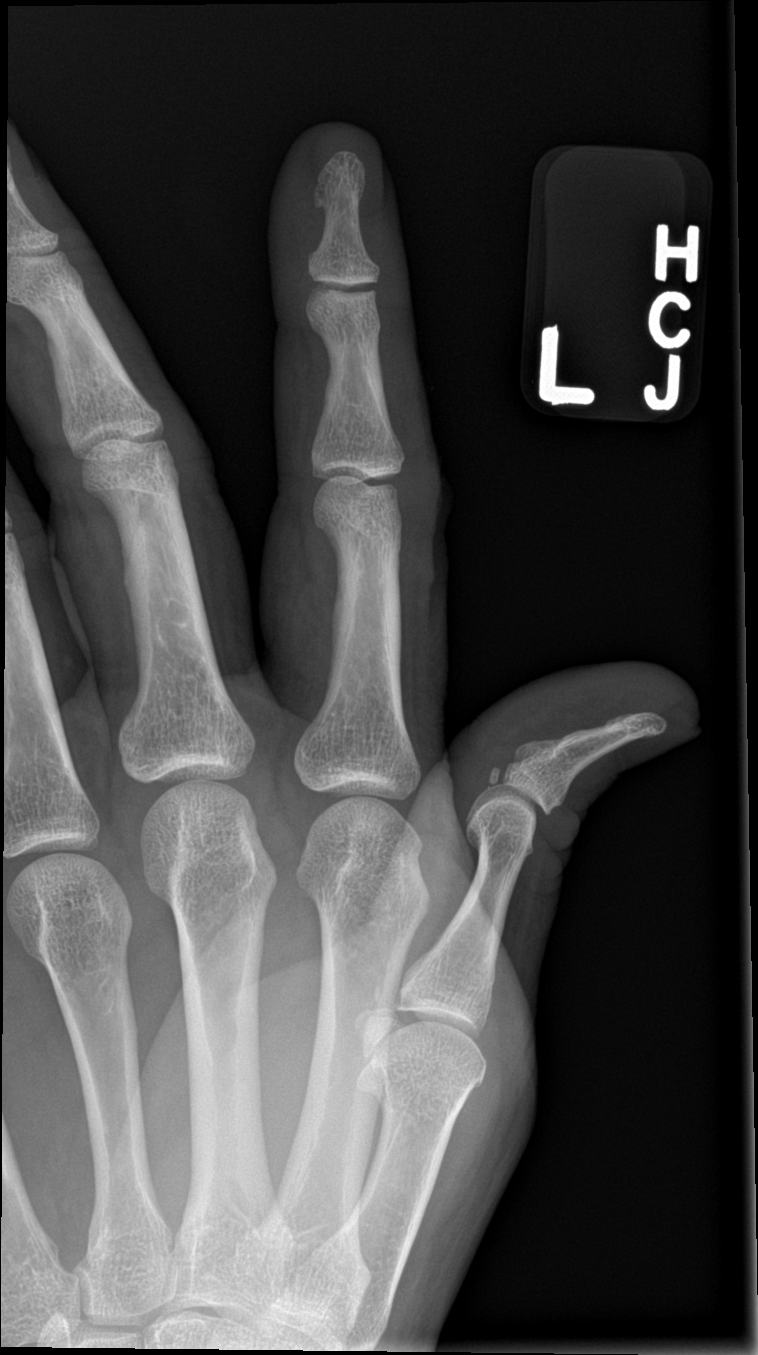

[finger lat]
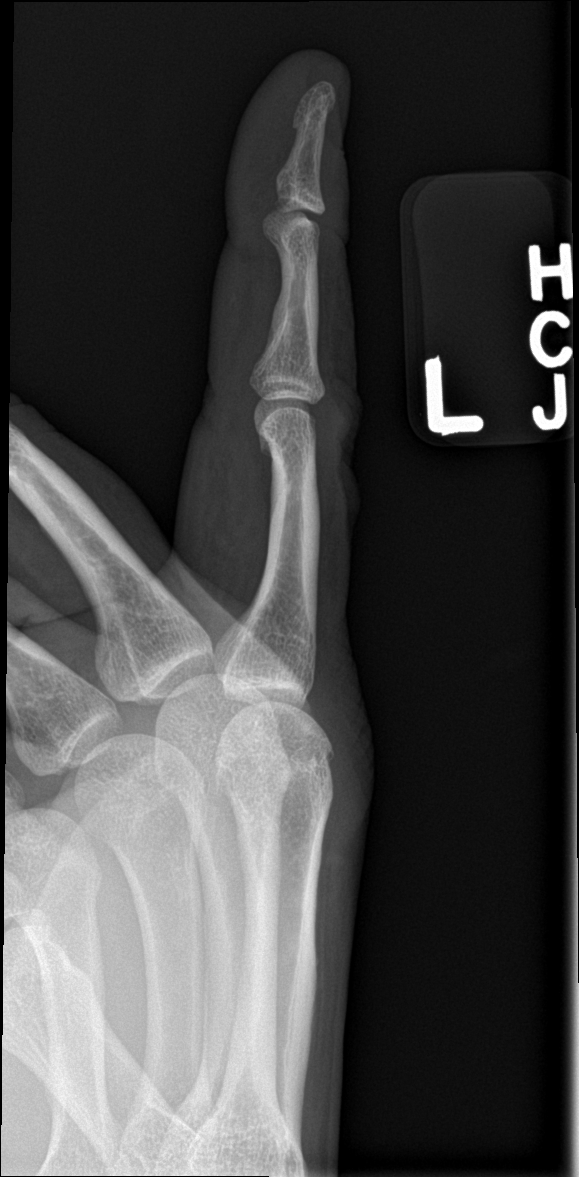

[3 of 3 positions shown; findings below may reference images not displayed]

FINDINGS: Cutaneous laceration of the index finger noted without foreign body
or underlying fracture. No acute bony findings.
IMPRESSION: 1. Cutaneous laceration of the left index finger.

## 2022-02-03 DIAGNOSIS — M1388 Other specified arthritis, other site: Secondary | ICD-10-CM | POA: Diagnosis not present

## 2022-02-05 ENCOUNTER — Emergency Department (HOSPITAL_COMMUNITY): Payer: BC Managed Care – PPO

## 2022-02-05 ENCOUNTER — Other Ambulatory Visit: Payer: Self-pay

## 2022-02-05 ENCOUNTER — Encounter (HOSPITAL_COMMUNITY): Payer: Self-pay

## 2022-02-05 ENCOUNTER — Emergency Department (HOSPITAL_COMMUNITY)
Admission: EM | Admit: 2022-02-05 | Discharge: 2022-02-05 | Disposition: A | Discharge status: Home / Self Care | Payer: BC Managed Care – PPO | Attending: Emergency Medicine | Admitting: Emergency Medicine

## 2022-02-05 DIAGNOSIS — S24109A Unspecified injury at unspecified level of thoracic spinal cord, initial encounter: Secondary | ICD-10-CM | POA: Diagnosis not present

## 2022-02-05 DIAGNOSIS — S233XXA Sprain of ligaments of thoracic spine, initial encounter: Secondary | ICD-10-CM | POA: Diagnosis not present

## 2022-02-05 DIAGNOSIS — M546 Pain in thoracic spine: Secondary | ICD-10-CM | POA: Diagnosis not present

## 2022-02-05 DIAGNOSIS — S299XXA Unspecified injury of thorax, initial encounter: Secondary | ICD-10-CM | POA: Diagnosis not present

## 2022-02-05 DIAGNOSIS — X500XXA Overexertion from strenuous movement or load, initial encounter: Secondary | ICD-10-CM | POA: Insufficient documentation

## 2022-02-05 DIAGNOSIS — S29019A Strain of muscle and tendon of unspecified wall of thorax, initial encounter: Secondary | ICD-10-CM

## 2022-02-05 MED ORDER — HYDROMORPHONE HCL 1 MG/ML IJ SOLN
0.5000 mg | Freq: Once | INTRAMUSCULAR | Status: AC
  Administered 2022-02-05: 0.5 mg via INTRAVENOUS
  Filled 2022-02-05: qty 0.5

## 2022-02-05 MED ORDER — ONDANSETRON HCL 4 MG/2ML IJ SOLN
4.0000 mg | Freq: Once | INTRAMUSCULAR | Status: AC
  Administered 2022-02-05: 4 mg via INTRAVENOUS
  Filled 2022-02-05: qty 2

## 2022-02-05 MED ORDER — PREDNISONE 10 MG PO TABS: ORAL_TABLET | ORAL | 0 refills | Status: DC

## 2022-02-05 MED ORDER — METHOCARBAMOL 500 MG PO TABS
500.0000 mg | ORAL_TABLET | Freq: Once | ORAL | Status: AC
  Administered 2022-02-05: 500 mg via ORAL
  Filled 2022-02-05: qty 1

## 2022-02-05 MED ORDER — OXYCODONE-ACETAMINOPHEN 5-325 MG PO TABS: 1.0000 | ORAL_TABLET | Freq: Four times a day (QID) | ORAL | 0 refills | Status: DC | PRN

## 2022-02-05 MED ORDER — DEXAMETHASONE SODIUM PHOSPHATE 10 MG/ML IJ SOLN
10.0000 mg | Freq: Once | INTRAMUSCULAR | Status: AC
  Administered 2022-02-05: 10 mg via INTRAVENOUS
  Filled 2022-02-05: qty 1

## 2022-02-05 NOTE — ED Triage Notes (Signed)
Pt c/o mid back pain.  Pt reports he was working on his lawnmower and "felt a pull."  Pain score 8/10.  Pt has not taken anything for pain.    Pt able to ambulate but difficulty standing and sitting.

## 2022-02-05 NOTE — ED Notes (Signed)
Patient transported to X-ray 

## 2022-02-05 NOTE — ED Provider Notes (Signed)
[facility] EMERGENCY DEPARTMENT Provider Note   CSN: [id number] Arrival date & time: 02/05/22  9326     History  Chief Complaint  Patient presents with   Back Pain    Ralph Murphy is a 51 y.o. male.  The history is provided by the patient.  Back Pain Location:  Thoracic spine Quality:  Stabbing Radiates to:  Does not radiate Pain severity:  Severe Pain is:  Same all the time Onset quality:  Sudden (was attempting to lift his lawnmower to a more stable position on its jack yesterday when he felt a sharp stabbing pain mid thoracic region.  worse today upon waking.) Progression:  Worsening Chronicity:  New Context: lifting heavy objects   Relieved by:  Nothing Worsened by:  Movement Ineffective treatments: borrowed family members percocet - this am, no relief. Associated symptoms: no abdominal pain, no numbness, no paresthesias, no tingling and no weakness        Home Medications Prior to Admission medications   Medication Sig Start Date End Date Taking? Authorizing Provider  oxyCODONE-acetaminophen (PERCOCET/ROXICET) 5-325 MG tablet Take 1 tablet by mouth every 6 (six) hours as needed. 02/05/22  Yes Littie Chiem, Raynelle Fanning, PA-C  predniSONE (DELTASONE) 10 MG tablet 6, 5, 4, 3, 2 then 1 tablet by mouth daily for 6 days total. 02/05/22  Yes Tamico Mundo, Raynelle Fanning, PA-C  esomeprazole (NEXIUM) 20 MG capsule Take 20 mg by mouth daily before breakfast.    [provider]      Allergies    Patient has no known allergies.    Review of Systems   Review of Systems  Gastrointestinal:  Negative for abdominal pain.  Musculoskeletal:  Positive for back pain.  Neurological:  Negative for tingling, weakness, numbness and paresthesias.    Physical Exam Updated Vital Signs BP 130/82   Pulse (!) 52   Temp 98.1 F (36.7 C)   Resp 18   SpO2 95%  Physical Exam Vitals and nursing note reviewed.  Constitutional:      Appearance: He is well-developed.  HENT:     Head: Normocephalic.  Eyes:      Conjunctiva/sclera: Conjunctivae normal.  Cardiovascular:     Rate and Rhythm: Normal rate.     Pulses: Normal pulses.     Comments: Pedal pulses normal. Pulmonary:     Effort: Pulmonary effort is normal.  Abdominal:     General: Bowel sounds are normal. There is no distension.     Palpations: Abdomen is soft. There is no mass.  Musculoskeletal:        General: Normal range of motion.     Cervical back: Normal range of motion and neck supple.     Thoracic back: Bony tenderness present.     Lumbar back: Tenderness present. No swelling, edema or spasms.     Comments: Point ttp mid thoracic spine. No palpable deformity, step offs, edema.   Skin:    General: Skin is warm and dry.  Neurological:     General: No focal deficit present.     Mental Status: He is alert and oriented to person, place, and time.     Sensory: No sensory deficit.     Motor: No tremor or atrophy.     Gait: Gait normal.     Comments: No strength deficit noted in hip and knee flexor and extensor muscle groups.  Ankle flexion and extension intact.     ED Results / Procedures / Treatments   Labs (all labs ordered are  listed, but only abnormal results are displayed) Labs Reviewed - No data to display  EKG None  Radiology DG Thoracic Spine 2 View  Result Date: 02/05/2022 CLINICAL DATA:  pain after lifting injury. EXAM: THORACIC SPINE 2 VIEWS COMPARISON:  None Available. FINDINGS: The alignment of the thoracic spine appears normal. The vertebral body heights and disc spaces are preserved. No signs of acute fracture. IMPRESSION: Negative. Electronically Signed   By: Signa Kell M.D.   On: 02/05/2022 10:35    Procedures Procedures    Medications Ordered in ED Medications  HYDROmorphone (DILAUDID) injection 0.5 mg (0.5 mg Intravenous Given 02/05/22 1040)  ondansetron (ZOFRAN) injection 4 mg (4 mg Intravenous Given 02/05/22 1039)  dexamethasone (DECADRON) injection 10 mg (10 mg Intravenous Given 02/05/22  1143)  HYDROmorphone (DILAUDID) injection 0.5 mg (0.5 mg Intravenous Given 02/05/22 1144)  methocarbamol (ROBAXIN) tablet 500 mg (500 mg Oral Given 02/05/22 1153)    ED Course/ Medical Decision Making/ A&P                           Medical Decision Making Pt with sudden onset mid thoracic back pain with lifting injury occurring yesterday.  Diff dx including thoracic compression fracture, herniated disk, muscle spasm, thoracic strain.   Amount and/or Complexity of Data Reviewed Radiology: ordered and independent interpretation performed.    Details: Normal, no compression fracture no plain film evidence suggesting reduced disc height.  Risk Prescription drug management.           Final Clinical Impression(s) / ED Diagnoses Final diagnoses:  Strain of thoracic region, initial encounter    Rx / DC Orders ED Discharge Orders          Ordered    oxyCODONE-acetaminophen (PERCOCET/ROXICET) 5-325 MG tablet  Every 6 hours PRN        02/05/22 1336    predniSONE (DELTASONE) 10 MG tablet        02/05/22 1336              Burgess Amor, PA-C 02/05/22 1340    Derwood Kaplan, MD 02/07/22 1223

## 2022-02-05 NOTE — Discharge Instructions (Signed)
Take medications as prescribed, do not drive within 4 hours of taking the oxycodone as this medication will make you drowsy.  Take your first dose of the prednisone tablets tomorrow as you have received today's dose of steroids through your IV line here.  Stay as active as you comfortably can avoiding any activity that worsens your pain.  You may benefit from ice packs to the site of pain throughout the next 2 days.  You may add a heating pad starting on Tuesday for 20 minutes several times daily.  Get rechecked for any persistent or worsening symptoms.

## 2022-02-05 NOTE — ED Notes (Signed)
Pt verbalized understanding of discharge instructions. Opportunity for questions provided.  

## 2022-08-10 ENCOUNTER — Encounter (HOSPITAL_COMMUNITY): Payer: Self-pay

## 2022-08-10 ENCOUNTER — Emergency Department (HOSPITAL_COMMUNITY)
Admission: EM | Admit: 2022-08-10 | Discharge: 2022-08-10 | Disposition: A | Discharge status: Home / Self Care | Payer: BC Managed Care – PPO | Attending: Emergency Medicine | Admitting: Emergency Medicine

## 2022-08-10 ENCOUNTER — Other Ambulatory Visit: Payer: Self-pay

## 2022-08-10 ENCOUNTER — Emergency Department (HOSPITAL_COMMUNITY): Payer: BC Managed Care – PPO

## 2022-08-10 DIAGNOSIS — S60222A Contusion of left hand, initial encounter: Secondary | ICD-10-CM

## 2022-08-10 DIAGNOSIS — S6992XA Unspecified injury of left wrist, hand and finger(s), initial encounter: Secondary | ICD-10-CM | POA: Diagnosis not present

## 2022-08-10 DIAGNOSIS — S60312A Abrasion of left thumb, initial encounter: Secondary | ICD-10-CM | POA: Insufficient documentation

## 2022-08-10 DIAGNOSIS — W231XXA Caught, crushed, jammed, or pinched between stationary objects, initial encounter: Secondary | ICD-10-CM | POA: Insufficient documentation

## 2022-08-10 DIAGNOSIS — M7989 Other specified soft tissue disorders: Secondary | ICD-10-CM | POA: Diagnosis not present

## 2022-08-10 MED ORDER — HYDROCODONE-ACETAMINOPHEN 5-325 MG PO TABS: 1.0000 | ORAL_TABLET | ORAL | 0 refills | Status: DC | PRN

## 2022-08-10 MED ORDER — HYDROCODONE-ACETAMINOPHEN 5-325 MG PO TABS
2.0000 | ORAL_TABLET | Freq: Once | ORAL | Status: AC
  Administered 2022-08-10: 2 via ORAL
  Filled 2022-08-10: qty 2

## 2022-08-10 NOTE — ED Provider Notes (Signed)
Ralph Murphy   CSN: [id number] Arrival date & time: 08/10/22  1648     History  Chief Complaint  Patient presents with   Hand Injury    Ralph Murphy is a 52 y.o. male.  Patient reports that a ladder slid closed crushing his left hand.  Patient complains of swelling and pain.  Patient denies any other areas of injury.  Patient complains of difficulty moving his hand.  The history is provided by the patient. No language interpreter was used.  Hand Injury Location:  Hand Hand location:  Dorsum of L hand Injury: yes   Mechanism of injury: crush   Crush injury:    Duration of crushing force:  3 hours Pain details:    Duration:  3 hours Dislocation: no   Foreign body present:  No foreign bodies Tetanus status:  Up to date Ineffective treatments:  None tried Risk factors: no known bone disorder        Home Medications Prior to Admission medications   Medication Sig Start Date End Date Taking? Authorizing Provider  esomeprazole (NEXIUM) 20 MG capsule Take 20 mg by mouth daily before breakfast.    [provider]  oxyCODONE-acetaminophen (PERCOCET/ROXICET) 5-325 MG tablet Take 1 tablet by mouth every 6 (six) hours as needed. 02/05/22   Idol, Almyra Free, PA-C  predniSONE (DELTASONE) 10 MG tablet 6, 5, 4, 3, 2 then 1 tablet by mouth daily for 6 days total. 02/05/22   Idol, Almyra Free, PA-C      Allergies    Patient has no known allergies.    Review of Systems   Review of Systems  Musculoskeletal:  Positive for myalgias.  All other systems reviewed and are negative.   Physical Exam Updated Vital Signs BP (!) 143/87 (BP Location: Right Arm)   Pulse 77   Temp 98.2 F (36.8 C) (Oral)   Resp 18   Ht '6\' 1"'$  (1.854 m)   Wt 99.8 kg   SpO2 98%   BMI 29.03 kg/m  Physical Exam Vitals reviewed.  Constitutional:      Appearance: Normal appearance.  Cardiovascular:     Rate and Rhythm: Normal rate.  Musculoskeletal:         General: Swelling and tenderness present.     Comments: Swollen tender left hand, abrasion base of left thumb dorsal aspect  nv and ns intact.  Decreased range of motion    Skin:    General: Skin is warm.  Neurological:     General: No focal deficit present.     Mental Status: He is alert.  Psychiatric:        Mood and Affect: Mood normal.     ED Results / Procedures / Treatments   Labs (all labs ordered are listed, but only abnormal results are displayed) Labs Reviewed - No data to display  EKG None  Radiology DG Hand Complete Left  Result Date: 08/10/2022 CLINICAL DATA:  Crush injury left hand EXAM: LEFT HAND - COMPLETE 3+ VIEW COMPARISON:  03/08/2021 FINDINGS: Frontal, oblique, and lateral views of the left hand are obtained. No acute fracture, subluxation, or dislocation. Joint spaces are well preserved. Soft tissue swelling along the thenar eminence and second digit. No radiopaque foreign body. IMPRESSION: 1. Soft tissue swelling.  No fracture or radiopaque foreign body. Electronically Signed   By: Randa Ngo M.D.   On: 08/10/2022 18:22    Procedures Procedures    Medications Ordered in ED  Medications  HYDROcodone-acetaminophen (NORCO/VICODIN) 5-325 MG per tablet 2 tablet (2 tablets Oral Given 08/10/22 1805)    ED Course/ Medical Decision Making/ A&P                             Medical Decision Making Pt complains of pain left hand after being cruched bya ladder   Amount and/or Complexity of Data Reviewed Radiology: ordered and independent interpretation performed. Decision-making details documented in ED Course.    Details: And shows no evidence of fracture  Risk Prescription drug management. Risk Details: Patient reports improved discomfort after pain medicine patient is placed in a thumb spica splint he is advised to follow-up with Dr. Rosey Bath orthopedic hand surgeon on-call.           Final Clinical Impression(s) / ED Diagnoses Final  diagnoses:  Contusion of left hand, initial encounter    Rx / DC Orders ED Discharge Orders          Ordered    HYDROcodone-acetaminophen (NORCO/VICODIN) 5-325 MG tablet  Every 4 hours PRN        08/10/22 1852           An After Visit Summary was printed and given to the patient.  An After Visit Summary was printed and given to the patient.    Sidney Ace 08/10/22 1854    Carmin Muskrat, MD 08/11/22 [phone]

## 2022-08-10 NOTE — ED Triage Notes (Signed)
Caught left hand in a ladder around 3 pm.

## 2022-08-10 NOTE — Discharge Instructions (Signed)
Return if any problems.  Follow up with Hand Surgery for evaltuion

## 2022-10-06 ENCOUNTER — Encounter: Payer: Self-pay | Admitting: Physician Assistant

## 2022-10-06 ENCOUNTER — Ambulatory Visit (INDEPENDENT_AMBULATORY_CARE_PROVIDER_SITE_OTHER): Payer: BC Managed Care – PPO | Admitting: Physician Assistant

## 2022-10-06 VITALS — BP 137/79 | HR 83 | Ht 73.0 in | Wt 223.0 lb

## 2022-10-06 DIAGNOSIS — Z1211 Encounter for screening for malignant neoplasm of colon: Secondary | ICD-10-CM

## 2022-10-06 DIAGNOSIS — Z720 Tobacco use: Secondary | ICD-10-CM | POA: Diagnosis not present

## 2022-10-06 DIAGNOSIS — G8929 Other chronic pain: Secondary | ICD-10-CM

## 2022-10-06 DIAGNOSIS — R739 Hyperglycemia, unspecified: Secondary | ICD-10-CM

## 2022-10-06 DIAGNOSIS — R200 Anesthesia of skin: Secondary | ICD-10-CM

## 2022-10-06 DIAGNOSIS — Z114 Encounter for screening for human immunodeficiency virus [HIV]: Secondary | ICD-10-CM

## 2022-10-06 DIAGNOSIS — M25511 Pain in right shoulder: Secondary | ICD-10-CM | POA: Diagnosis not present

## 2022-10-06 DIAGNOSIS — L989 Disorder of the skin and subcutaneous tissue, unspecified: Secondary | ICD-10-CM

## 2022-10-06 DIAGNOSIS — Z1159 Encounter for screening for other viral diseases: Secondary | ICD-10-CM

## 2022-10-06 DIAGNOSIS — Z800 Family history of malignant neoplasm of digestive organs: Secondary | ICD-10-CM

## 2022-10-06 NOTE — Progress Notes (Unsigned)
New patient visit   Patient: Ralph Murphy   DOB: August 12, 1970   52 y.o. Male  MRN: [id number] Visit Date: 10/06/2022  Today's healthcare provider: [name], PA-C   Chief Complaint  Patient presents with   New Patient (Initial Visit)    Dark spot-on the snow --tender, --2 years colonoscopy   Subjective    Ralph Murphy is a 52 y.o. male who presents today as a new patient to establish care.  HPI Pt has some concerns today:  His father was diagnosed with colon cancer at age 76-69, deceased from disease. He would like colon cancer screening.   History of a broken nose w/ darkened skin on his nose that has grown, additional similar looking dark spot on his right cheek.  Pt reports b/l finger/hand numbness, wakes from sleep.   Pt reports injury to right shoulder/collarbone and he feels the collarbone has moved in front of his sternum. Reports right shoulder pain daily.  H/o throat surgery-- swallowed metal needed removal. H/o smoking, quit 08/25/2018, but 1-3 packs a day for ~35 years.    Pt agreeable to shingles vaccine, but declines vaccines today.  Past Medical History:  Diagnosis Date   GERD (gastroesophageal reflux disease)    Past Surgical History:  Procedure Laterality Date   KNEE SURGERY     THROAT SURGERY     Family Status  Relation Name Status   Father  Deceased   Family History  Problem Relation Age of Onset   Colon cancer Father    Social History   Socioeconomic History   Marital status: Single    Spouse name: Not on file   Number of children: Not on file   Years of education: Not on file   Highest education level: Not on file  Occupational History   Not on file  Tobacco Use   Smoking status: Former    Packs/day: 1    Types: Cigarettes   Smokeless tobacco: Never   Tobacco comments:    Quit smoking ?   Substance and Sexual Activity   Alcohol use: Not Currently    Comment: stopped 10/2017   Drug use: Yes    Types: Marijuana    Comment:  daily   Sexual activity: Yes  Other Topics Concern   Not on file  Social History Narrative   Not on file   Social Determinants of Health   Financial Resource Strain: Not on file  Food Insecurity: Not on file  Transportation Needs: Not on file  Physical Activity: Not on file  Stress: Not on file  Social Connections: Not on file   Outpatient Medications Prior to Visit  Medication Sig   esomeprazole (NEXIUM) 20 MG capsule Take 20 mg by mouth daily before breakfast.   [DISCONTINUED] HYDROcodone-acetaminophen (NORCO/VICODIN) 5-325 MG tablet Take 1 tablet by mouth every 4 (four) hours as needed for moderate pain.   [DISCONTINUED] oxyCODONE-acetaminophen (PERCOCET/ROXICET) 5-325 MG tablet Take 1 tablet by mouth every 6 (six) hours as needed.   [DISCONTINUED] predniSONE (DELTASONE) 10 MG tablet 6, 5, 4, 3, 2 then 1 tablet by mouth daily for 6 days total.   No facility-administered medications prior to visit.   No Known Allergies  Immunization History  Administered Date(s) Administered   Tdap 01/04/2021    Health Maintenance  Topic Date Due   COVID-19 Vaccine (1) Never done   HIV Screening  Never done   Hepatitis C Screening  Never done   COLONOSCOPY (Pts 45-93yrs Insurance  coverage will need to be confirmed)  Never done   Lung Cancer Screening  Never done   Zoster Vaccines- Shingrix (1 of 2) Never done   INFLUENZA VACCINE  12/28/2022   DTaP/Tdap/Td (2 - Td or Tdap) 01/05/2031   HPV VACCINES  Aged Out    Patient Care Team: [name], PA-C as PCP - General (Physician Assistant)  Review of Systems  Constitutional:  Negative for fatigue and fever.  Respiratory:  Negative for cough and shortness of breath.   Cardiovascular:  Negative for chest pain, palpitations and leg swelling.  Genitourinary:  Positive for frequency.  Musculoskeletal:  Positive for arthralgias.  Neurological:  Positive for numbness. Negative for dizziness and headaches.     Objective    BP  137/79 (BP Location: Left Arm, Patient Position: Sitting, Cuff Size: Large)   Pulse 83   Ht 6\' 1"  (1.854 m)   Wt 223 lb (101.2 kg)   SpO2 96%   BMI 29.42 kg/m    Physical Exam Constitutional:      General: He is awake.     Appearance: He is well-developed.  HENT:     Head: Normocephalic.     Right Ear: Tympanic membrane, ear canal and external ear normal.     Left Ear: Tympanic membrane, ear canal and external ear normal.     Nose: Nose normal. No congestion or rhinorrhea.     Mouth/Throat:     Mouth: Mucous membranes are moist.     Pharynx: No oropharyngeal exudate or posterior oropharyngeal erythema.  Eyes:     Pupils: Pupils are equal, round, and reactive to light.  Cardiovascular:     Rate and Rhythm: Normal rate and regular rhythm.     Heart sounds: Normal heart sounds.  Pulmonary:     Effort: Pulmonary effort is normal.     Breath sounds: Normal breath sounds.  Chest:     Comments: R sternoclaviular joint does protrude more than the L Abdominal:     General: There is no distension.     Palpations: Abdomen is soft.     Tenderness: There is no abdominal tenderness. There is no guarding.  Musculoskeletal:     Cervical back: Normal range of motion.     Right lower leg: No edema.     Left lower leg: No edema.  Lymphadenopathy:     Cervical: No cervical adenopathy.  Skin:    General: Skin is warm.     Comments: Hyperpigmentation to bridge of nose and R cheek  Neurological:     Mental Status: He is alert and oriented to person, place, and time.  Psychiatric:        Attention and Perception: Attention normal.        Mood and Affect: Mood normal.        Speech: Speech normal.        Behavior: Behavior normal. Behavior is cooperative.     Depression Screen    10/06/2022    1:54 PM  PHQ 2/9 Scores  PHQ - 2 Score 0  PHQ- 9 Score 3   No results found for any visits on 10/06/22.  Assessment & Plan      Problem List Items Addressed This Visit       Other    Chronic right shoulder pain    Palpable abnormality R sternum. Referred to ortho      Relevant Orders   Ambulatory referral to Orthopedics   Tobacco use    Pack  year history at least 35 years. Quit 2020. Advised lung cancer screening, pt agreeable, referred.       Relevant Orders   Ambulatory Referral Lung Cancer Screening [facility]   Hand numbness    Carpal vs cubital tunnel. Advised how to treat both at home with braces/compression sleeves. If no improvement/relief would refer for EMG      Other Visit Diagnoses     Family history of colon cancer    -  Primary   Relevant Orders   Ambulatory referral to Gastroenterology   Colon cancer screening       Relevant Orders   Ambulatory referral to Gastroenterology   Skin lesion       Relevant Orders   Ambulatory referral to Dermatology   Hyperglycemia       Relevant Orders   CBC w/Diff/Platelet   Comprehensive Metabolic Panel (CMET)   HgB A1c   Lipid Profile   Encounter for hepatitis C screening test for low risk patient       Relevant Orders   Hepatitis C antibody   Encounter for screening for HIV       Relevant Orders   HIV antibody (with reflex)      6. Skin lesion Darkened skin on nose/R cheek. Likely sun related does not appear cancerous, but referring to derm.  - Ambulatory referral to Dermatology  7. Hyperglycemia Historically with check a1c - CBC w/Diff/Platelet - Comprehensive Metabolic Panel (CMET) - HgB A1c - Lipid Profile   Return in about 1 year (around 10/06/2023) for CPE; or earlier pending labs.     I, [name], PA-C have reviewed all documentation for this visit. The documentation on  10/06/22   for the exam, diagnosis, procedures, and orders are all accurate and complete.  [name], PA-C [facility] [address] [place], [place], [address] Office: [phone] Fax: [phone]   [facility]

## 2022-10-09 ENCOUNTER — Encounter: Payer: Self-pay | Admitting: Physician Assistant

## 2022-10-09 DIAGNOSIS — Z720 Tobacco use: Secondary | ICD-10-CM | POA: Insufficient documentation

## 2022-10-09 DIAGNOSIS — G8929 Other chronic pain: Secondary | ICD-10-CM | POA: Insufficient documentation

## 2022-10-09 DIAGNOSIS — R200 Anesthesia of skin: Secondary | ICD-10-CM | POA: Insufficient documentation

## 2022-10-09 NOTE — Assessment & Plan Note (Signed)
Pack year history at least 35 years. Quit 2020. Advised lung cancer screening, pt agreeable, referred.

## 2022-10-09 NOTE — Assessment & Plan Note (Signed)
Carpal vs cubital tunnel. Advised how to treat both at home with braces/compression sleeves. If no improvement/relief would refer for EMG

## 2022-10-09 NOTE — Assessment & Plan Note (Signed)
Palpable abnormality R sternum. Referred to ortho

## 2022-10-10 ENCOUNTER — Telehealth: Payer: Self-pay

## 2022-10-10 NOTE — Telephone Encounter (Signed)
Gastroenterology Pre-Procedure Review  Office visit has been scheduled to see Ralph Murphy on 10/20/22 at 8:15am. Colonoscopy to be scheduled during visit.  Request Date: TBD Requesting Physician: Dr. Jodelle Gross  PATIENT REVIEW QUESTIONS: The patient responded to the following health history questions as indicated:    1. Are you having any GI issues? yes (constipation, heartburn, pain in side) 2. Do you have a personal history of Polyps? no 3. Do you have a family history of Colon Cancer or Polyps? yes (father and cousin colorectal cancer) 4. Diabetes Mellitus? no 5. Joint replacements in the past 12 months?no 6. Major health problems in the past 3 months?no 7. Any artificial heart valves, MVP, or defibrillator?no    MEDICATIONS & ALLERGIES:    Patient reports the following regarding taking any anticoagulation/antiplatelet therapy:   Plavix, Coumadin, Eliquis, Xarelto, Lovenox, Pradaxa, Brilinta, or Effient? no Aspirin? no  Patient confirms/reports the following medications:  Current Outpatient Medications  Medication Sig Dispense Refill   esomeprazole (NEXIUM) 20 MG capsule Take 20 mg by mouth daily before breakfast.     No current facility-administered medications for this visit.    Patient confirms/reports the following allergies:  No Known Allergies  No orders of the defined types were placed in this encounter.   AUTHORIZATION INFORMATION Primary Insurance: 1D#: Group #:  Secondary Insurance: 1D#: Group #:  SCHEDULE INFORMATION: Date: TBD Time: Location: TBD

## 2022-10-13 DIAGNOSIS — Z1159 Encounter for screening for other viral diseases: Secondary | ICD-10-CM | POA: Diagnosis not present

## 2022-10-13 DIAGNOSIS — R739 Hyperglycemia, unspecified: Secondary | ICD-10-CM | POA: Diagnosis not present

## 2022-10-13 DIAGNOSIS — Z114 Encounter for screening for human immunodeficiency virus [HIV]: Secondary | ICD-10-CM | POA: Diagnosis not present

## 2022-10-14 LAB — CBC WITH DIFFERENTIAL/PLATELET
Basophils Absolute: 0 10*3/uL (ref 0.0–0.2)
Basos: 1 %
EOS (ABSOLUTE): 0.1 10*3/uL (ref 0.0–0.4)
Eos: 1 %
Hematocrit: 47.2 % (ref 37.5–51.0)
Hemoglobin: 15.7 g/dL (ref 13.0–17.7)
Immature Grans (Abs): 0 10*3/uL (ref 0.0–0.1)
Immature Granulocytes: 0 %
Lymphocytes Absolute: 1.6 10*3/uL (ref 0.7–3.1)
Lymphs: 29 %
MCH: 30.5 pg (ref 26.6–33.0)
MCHC: 33.3 g/dL (ref 31.5–35.7)
MCV: 92 fL (ref 79–97)
Monocytes Absolute: 0.5 10*3/uL (ref 0.1–0.9)
Monocytes: 9 %
Neutrophils Absolute: 3.5 10*3/uL (ref 1.4–7.0)
Neutrophils: 60 %
Platelets: 219 10*3/uL (ref 150–450)
RBC: 5.14 x10E6/uL (ref 4.14–5.80)
RDW: 12.7 % (ref 11.6–15.4)
WBC: 5.8 10*3/uL (ref 3.4–10.8)

## 2022-10-14 LAB — COMPREHENSIVE METABOLIC PANEL
ALT: 37 IU/L (ref 0–44)
AST: 23 IU/L (ref 0–40)
Albumin/Globulin Ratio: 2 (ref 1.2–2.2)
Albumin: 4.2 g/dL (ref 3.8–4.9)
Alkaline Phosphatase: 116 IU/L (ref 44–121)
BUN/Creatinine Ratio: 13 (ref 9–20)
BUN: 13 mg/dL (ref 6–24)
Bilirubin Total: 0.2 mg/dL (ref 0.0–1.2)
CO2: 23 mmol/L (ref 20–29)
Calcium: 9 mg/dL (ref 8.7–10.2)
Chloride: 102 mmol/L (ref 96–106)
Creatinine, Ser: 0.98 mg/dL (ref 0.76–1.27)
Globulin, Total: 2.1 g/dL (ref 1.5–4.5)
Glucose: 103 mg/dL — ABNORMAL HIGH (ref 70–99)
Potassium: 4.3 mmol/L (ref 3.5–5.2)
Sodium: 140 mmol/L (ref 134–144)
Total Protein: 6.3 g/dL (ref 6.0–8.5)
eGFR: 93 mL/min/{1.73_m2} (ref >59)

## 2022-10-14 LAB — LIPID PANEL
Chol/HDL Ratio: 4.6 ratio (ref 0.0–5.0)
Cholesterol, Total: 158 mg/dL (ref 100–199)
HDL: 34 mg/dL — ABNORMAL LOW (ref >39)
LDL Chol Calc (NIH): 88 mg/dL (ref 0–99)
Triglycerides: 211 mg/dL — ABNORMAL HIGH (ref 0–149)
VLDL Cholesterol Cal: 36 mg/dL (ref 5–40)

## 2022-10-14 LAB — HIV ANTIBODY (ROUTINE TESTING W REFLEX): HIV Screen 4th Generation wRfx: NONREACTIVE

## 2022-10-14 LAB — HEPATITIS C ANTIBODY: Hep C Virus Ab: NONREACTIVE

## 2022-10-14 LAB — HEMOGLOBIN A1C
Est. average glucose Bld gHb Est-mCnc: 123 mg/dL
Hgb A1c MFr Bld: 5.9 % — ABNORMAL HIGH (ref 4.8–5.6)

## 2022-10-19 NOTE — Progress Notes (Signed)
Ralph Amy, PA-C [address]  [address]  [place], [place] [address]  Main: [phone]  Fax: [phone]   Gastroenterology Consultation  Referring Provider:     [name], PA-C Primary Care Physician:  [name], PA-C Primary Gastroenterologist:  Ralph Amy, PA-C / Dr. Wyline Mood  Reason for Consultation:     Side Pain, Constipation, GERD, Fam Hx Colon Cancer        HPI:   Ralph Murphy is a 52 y.o. y/o male referred for consultation & management  by [name], PA-C.    He has been having intermittent left lower quadrant and left side pain for 10 years.  Has had worsening LLQ pain in the past few weeks.  Worse after he eats.  Relieved after passing gas and having a bowel movement.  Currently has LLQ pain 5 days/week.  Pain is 7 out of 10.  He denies rectal bleeding or unintentional weight loss.  Has chronic constipation.  Taking Metamucil and OTC stool softener which helps, yet not controlled.  He reports having acid reflux for many years.  Has been on Nexium for many years.  Currently taking Nexium 20 mg daily.  Occasional breakthrough heartburn.  Denies dysphagia.  No family history of esophageal or stomach cancer.  His Father Dx with Colon Cancer age 8, deceased.   Pt. Has never had a Colonoscopy.  Labs 10/13/22: Negative HCV and HIV; Normal CBC (Hgb 15.7g); Normal CMP.  Last CT abd/pelvis in 2012: No Acute abnormality.  No recent abdominal Imaging.   Past Medical History:  Diagnosis Date   GERD (gastroesophageal reflux disease)     Past Surgical History:  Procedure Laterality Date   KNEE SURGERY     THROAT SURGERY      Prior to Admission medications   Medication Sig Start Date End Date Taking? Authorizing Provider  esomeprazole (NEXIUM) 20 MG capsule Take 20 mg by mouth daily before breakfast.    [provider]    Family History  Problem Relation Age of Onset   Colon cancer Father      Social History   Tobacco Use    Smoking status: Former    Packs/day: 1    Types: Cigarettes   Smokeless tobacco: Never   Tobacco comments:    Quit smoking ?   Substance Use Topics   Alcohol use: Not Currently    Comment: stopped 10/2017   Drug use: Yes    Types: Marijuana    Comment: daily    Allergies as of 10/20/2022   (No Known Allergies)    Review of Systems:    All systems reviewed and negative except where noted in HPI.   Physical Exam:  There were no vitals taken for this visit. No LMP for male patient. Psych:  Alert and cooperative. Normal mood and affect. General:   Alert,  Well-developed, well-nourished, pleasant and cooperative in NAD Head:  Normocephalic and atraumatic. Eyes:  Sclera clear, no icterus.   Conjunctiva pink. Lungs:  Respirations even and unlabored.  Clear throughout to auscultation.   No wheezes, crackles, or rhonchi. No acute distress. Heart:  Regular rate and rhythm; no murmurs, clicks, rubs, or gallops. Abdomen:  Normal bowel sounds.  No bruits.  Soft, and non-distended without masses, hepatosplenomegaly or hernias noted.  Moderate LLQ Tenderness; Mild RLQ Tenderness; No Upper abdominal tenderness.  No guarding or rebound tenderness.    Neurologic:  Alert and oriented x3;  grossly normal neurologically. Psych:  Alert  and cooperative. Normal mood and affect.  Imaging Studies: No results found.  Assessment and Plan:   ALIKAI PIGNATELLI is a 52 y.o. y/o male has been referred for Multiple GI Symptoms.  No previous EGD or Colonsocopy.  He is due for his first screening Colonoscopy due to age.  Side Pain - LLQ, Left Side Abdominal Pain  CT abd / pelvis with Contrast  Rule Out Diverticulitis - Moderate tenderness on exam today.  CT is negative, then treat constipation and proceed with colonoscopy.  Constipation  Continue Metamucil daily.  Add prescription Linzess 145 mcg 1 capsule once daily.  Prescription sent.  High-fiber diet.  64 ounces of water daily.  GERD  Scheduling EGD I  discussed risks of EGD with patient to include risk of bleeding, perforation, and risk of sedation.   Patient expressed understanding and agrees to proceed with EGD.   Continue Nexium 20 mg once daily.  Add OTC Pepcid 20 mg once or twice daily.  Recommend Lifestyle Modifications to prevent Acid Reflux.  Rec. Avoid coffee, sodas, peppermint, citrus fruits, and spicey foods.  Avoid eating 2-3 hours before bedtime.   Family History of Colon Cancer - Father Age 60  Scheduling Colonoscopy I discussed risks of colonoscopy with patient to include risk of bleeding, colon perforation, and risk of sedation.  Patient expressed understanding and agrees to proceed with colonoscopy.   Colon Cancer Screening   Scheduling Colonoscopy.   Follow up in 4 weeks after EGD / Colon with TG.  Ralph Amy, PA-C

## 2022-10-20 ENCOUNTER — Ambulatory Visit (INDEPENDENT_AMBULATORY_CARE_PROVIDER_SITE_OTHER): Payer: BC Managed Care – PPO | Admitting: Physician Assistant

## 2022-10-20 ENCOUNTER — Other Ambulatory Visit: Payer: Self-pay

## 2022-10-20 ENCOUNTER — Encounter: Payer: Self-pay | Admitting: Physician Assistant

## 2022-10-20 VITALS — BP 132/79 | HR 67 | Temp 98.5°F | Ht 73.0 in | Wt 225.8 lb

## 2022-10-20 DIAGNOSIS — K5909 Other constipation: Secondary | ICD-10-CM | POA: Diagnosis not present

## 2022-10-20 DIAGNOSIS — Z800 Family history of malignant neoplasm of digestive organs: Secondary | ICD-10-CM

## 2022-10-20 DIAGNOSIS — K219 Gastro-esophageal reflux disease without esophagitis: Secondary | ICD-10-CM

## 2022-10-20 DIAGNOSIS — R1032 Left lower quadrant pain: Secondary | ICD-10-CM | POA: Diagnosis not present

## 2022-10-20 DIAGNOSIS — R109 Unspecified abdominal pain: Secondary | ICD-10-CM

## 2022-10-20 DIAGNOSIS — Z1211 Encounter for screening for malignant neoplasm of colon: Secondary | ICD-10-CM

## 2022-10-20 MED ORDER — PEG 3350-KCL-NA BICARB-NACL 420 G PO SOLR: 4000.0000 mL | Freq: Once | ORAL | 0 refills | Status: AC

## 2022-10-20 MED ORDER — LINACLOTIDE 145 MCG PO CAPS
145.0000 ug | ORAL_CAPSULE | Freq: Every day | ORAL | Status: AC
Start: 2022-10-21 — End: 2023-01-19

## 2022-10-20 NOTE — Patient Instructions (Addendum)
CT abdomen and pelvis with contrast scheduled 5/31/@ 12:15 @ [facility].  [address]  [place]                 Constipation, Adult Constipation is when a person has fewer than three bowel movements in a week, has difficulty having a bowel movement, or has stools (feces) that are dry, hard, or larger than normal. Constipation may be caused by an underlying condition. It may become worse with age if a person takes certain medicines and does not take in enough fluids. Follow these instructions at home: Eating and drinking  Eat foods that have a lot of fiber, such as beans, whole grains, and fresh fruits and vegetables. Limit foods that are low in fiber and high in fat and processed sugars, such as fried or sweet foods. These include french fries, hamburgers, cookies, candies, and soda. Drink enough fluid to keep your urine pale yellow. General instructions Exercise regularly or as told by your health care provider. Try to do 150 minutes of moderate exercise each week. Use the bathroom when you have the urge to go. Do not hold it in. Take over-the-counter and prescription medicines only as told by your health care provider. This includes any fiber supplements. During bowel movements: Practice deep breathing while relaxing the lower abdomen. Practice pelvic floor relaxation. Watch your condition for any changes. Let your health care provider know about them. Keep all follow-up visits as told by your health care provider. This is important. Contact a health care provider if: You have pain that gets worse. You have a fever. You do not have a bowel movement after 4 days. You vomit. You are not hungry or you lose weight. You are bleeding from the opening between the buttocks (anus). You have thin, pencil-like stools. Get help right away if: You have a fever and your symptoms suddenly get worse. You leak stool or have blood in your stool. Your abdomen is  bloated. You have severe pain in your abdomen. You feel dizzy or you faint. Summary Constipation is when a person has fewer than three bowel movements in a week, has difficulty having a bowel movement, or has stools (feces) that are dry, hard, or larger than normal. Eat foods that have a lot of fiber, such as beans, whole grains, and fresh fruits and vegetables. Drink enough fluid to keep your urine pale yellow. Take over-the-counter and prescription medicines only as told by your health care provider. This includes any fiber supplements. This information is not intended to replace advice given to you by your health care provider. Make sure you discuss any questions you have with your health care provider. Document Revised: 03/29/2022 Document Reviewed: 03/29/2022 Elsevier Patient Education  2024 [employer] Food Choices for Gastroesophageal Reflux Disease, Adult When you have gastroesophageal reflux disease (GERD), the foods you eat and your eating habits are very important. Choosing the right foods can help ease the discomfort of GERD. Consider working with a dietitian to help you make healthy food choices. What are tips for following this plan? Reading food labels Look for foods that are low in saturated fat. Foods that have less than 5% of daily value (DV) of fat and 0 g of trans fats may help with your symptoms. Cooking Cook foods using methods other than frying. This may include baking, steaming, grilling, or broiling. These are all methods that do not need a lot of fat for cooking. To add flavor, try to use herbs that  are low in spice and acidity. Meal planning  Choose healthy foods that are low in fat, such as fruits, vegetables, whole grains, low-fat dairy products, lean meats, fish, and poultry. Eat frequent, small meals instead of three large meals each day. Eat your meals slowly, in a relaxed setting. Avoid bending over or lying down until 2-3 hours after eating. Limit high-fat  foods such as fatty meats or fried foods. Limit your intake of fatty foods, such as oils, butter, and shortening. Avoid the following as told by your health care provider: Foods that cause symptoms. These may be different for different people. Keep a food diary to keep track of foods that cause symptoms. Alcohol. Drinking large amounts of liquid with meals. Eating meals during the 2-3 hours before bed. Lifestyle Maintain a healthy weight. Ask your health care provider what weight is healthy for you. If you need to lose weight, work with your health care provider to do so safely. Exercise for at least 30 minutes on 5 or more days each week, or as told by your health care provider. Avoid wearing clothes that fit tightly around your waist and chest. Do not use any products that contain nicotine or tobacco. These products include cigarettes, chewing tobacco, and vaping devices, such as e-cigarettes. If you need help quitting, ask your health care provider. Sleep with the head of your bed raised. Use a wedge under the mattress or blocks under the bed frame to raise the head of the bed. Chew sugar-free gum after mealtimes. What foods should I eat?  Eat a healthy, well-balanced diet of fruits, vegetables, whole grains, low-fat dairy products, lean meats, fish, and poultry. Each person is different. Foods that may trigger symptoms in one person may not trigger any symptoms in another person. Work with your health care provider to identify foods that are safe for you. The items listed above may not be a complete list of recommended foods and beverages. Contact a dietitian for more information. What foods should I avoid? Limiting some of these foods may help manage the symptoms of GERD. Everyone is different. Consult a dietitian or your health care provider to help you identify the exact foods to avoid, if any. Fruits Any fruits prepared with added fat. Any fruits that cause symptoms. For some people this  may include citrus fruits, such as oranges, grapefruit, pineapple, and lemons. Vegetables Deep-fried vegetables. [place] fries. Any vegetables prepared with added fat. Any vegetables that cause symptoms. For some people, this may include tomatoes and tomato products, chili peppers, onions and garlic, and horseradish. Grains Pastries or quick breads with added fat. Meats and other proteins High-fat meats, such as fatty beef or pork, hot dogs, ribs, ham, sausage, salami, and bacon. Fried meat or protein, including fried fish and fried chicken. Nuts and nut butters, in large amounts. Dairy Whole milk and chocolate milk. Sour cream. Cream. Ice cream. Cream cheese. Milkshakes. Fats and oils Butter. Margarine. Shortening. Ghee. Beverages Coffee and tea, with or without caffeine. Carbonated beverages. Sodas. Energy drinks. Fruit juice made with acidic fruits, such as orange or grapefruit. Tomato juice. Alcoholic drinks. Sweets and desserts Chocolate and cocoa. Donuts. Seasonings and condiments Pepper. Peppermint and spearmint. Added salt. Any condiments, herbs, or seasonings that cause symptoms. For some people, this may include curry, hot sauce, or vinegar-based salad dressings. The items listed above may not be a complete list of foods and beverages to avoid. Contact a dietitian for more information. Questions to ask your health care provider  Diet and lifestyle changes are usually the first steps that are taken to manage symptoms of GERD. If diet and lifestyle changes do not improve your symptoms, talk with your health care provider about taking medicines. Where to find more information International Foundation for Gastrointestinal Disorders: aboutgerd.org Summary When you have gastroesophageal reflux disease (GERD), food and lifestyle choices may be very helpful in easing the discomfort of GERD. Eat frequent, small meals instead of three large meals each day. Eat your meals slowly, in a relaxed  setting. Avoid bending over or lying down until 2-3 hours after eating. Limit high-fat foods such as fatty meats or fried foods. This information is not intended to replace advice given to you by your health care provider. Make sure you discuss any questions you have with your health care provider. Document Revised: 11/24/2019 Document Reviewed: 11/24/2019 Elsevier Patient Education  2024 [employer].

## 2022-10-27 ENCOUNTER — Ambulatory Visit: Admission: RE | Admit: 2022-10-27 | Payer: BC Managed Care – PPO | Source: Ambulatory Visit

## 2022-10-30 DIAGNOSIS — D485 Neoplasm of uncertain behavior of skin: Secondary | ICD-10-CM | POA: Diagnosis not present

## 2022-10-30 DIAGNOSIS — L570 Actinic keratosis: Secondary | ICD-10-CM | POA: Diagnosis not present

## 2022-11-10 DIAGNOSIS — S4361XA Sprain of right sternoclavicular joint, initial encounter: Secondary | ICD-10-CM | POA: Diagnosis not present

## 2022-11-15 ENCOUNTER — Other Ambulatory Visit: Payer: Self-pay | Admitting: Orthopedic Surgery

## 2022-11-15 DIAGNOSIS — S4361XA Sprain of right sternoclavicular joint, initial encounter: Secondary | ICD-10-CM

## 2022-11-19 ENCOUNTER — Encounter (HOSPITAL_COMMUNITY): Payer: Self-pay | Admitting: *Deleted

## 2022-11-19 ENCOUNTER — Emergency Department (HOSPITAL_COMMUNITY): Payer: BC Managed Care – PPO

## 2022-11-19 ENCOUNTER — Emergency Department (HOSPITAL_COMMUNITY)
Admission: EM | Admit: 2022-11-19 | Discharge: 2022-11-19 | Disposition: A | Discharge status: Home / Self Care | Payer: BC Managed Care – PPO | Attending: Emergency Medicine | Admitting: Emergency Medicine

## 2022-11-19 ENCOUNTER — Other Ambulatory Visit: Payer: Self-pay

## 2022-11-19 DIAGNOSIS — S300XXA Contusion of lower back and pelvis, initial encounter: Secondary | ICD-10-CM | POA: Insufficient documentation

## 2022-11-19 DIAGNOSIS — M25551 Pain in right hip: Secondary | ICD-10-CM | POA: Diagnosis not present

## 2022-11-19 DIAGNOSIS — W11XXXA Fall on and from ladder, initial encounter: Secondary | ICD-10-CM | POA: Diagnosis not present

## 2022-11-19 DIAGNOSIS — M47816 Spondylosis without myelopathy or radiculopathy, lumbar region: Secondary | ICD-10-CM | POA: Diagnosis not present

## 2022-11-19 DIAGNOSIS — M545 Low back pain, unspecified: Secondary | ICD-10-CM | POA: Diagnosis not present

## 2022-11-19 DIAGNOSIS — M5136 Other intervertebral disc degeneration, lumbar region: Secondary | ICD-10-CM | POA: Diagnosis not present

## 2022-11-19 MED ORDER — NAPROXEN 500 MG PO TABS: 500.0000 mg | ORAL_TABLET | Freq: Two times a day (BID) | ORAL | 0 refills | Status: DC

## 2022-11-19 MED ORDER — KETOROLAC TROMETHAMINE 15 MG/ML IJ SOLN
15.0000 mg | Freq: Once | INTRAMUSCULAR | Status: AC
  Administered 2022-11-19: 15 mg via INTRAMUSCULAR
  Filled 2022-11-19: qty 1

## 2022-11-19 MED ORDER — HYDROCODONE-ACETAMINOPHEN 5-325 MG PO TABS
1.0000 | ORAL_TABLET | Freq: Once | ORAL | Status: AC
  Administered 2022-11-19: 1 via ORAL
  Filled 2022-11-19: qty 1

## 2022-11-19 MED ORDER — METHOCARBAMOL 500 MG PO TABS: 500.0000 mg | ORAL_TABLET | Freq: Four times a day (QID) | ORAL | 0 refills | Status: DC | PRN

## 2022-11-19 MED ORDER — HYDROCODONE-ACETAMINOPHEN 5-325 MG PO TABS: 1.0000 | ORAL_TABLET | Freq: Four times a day (QID) | ORAL | 0 refills | Status: DC | PRN

## 2022-11-19 NOTE — ED Provider Notes (Signed)
[place] [facility]ERGENCY DEPARTMENT AT [facility] Provider Note   CSN: [id number] Arrival date & time: 11/19/22  1254     History  Chief Complaint  Patient presents with   Ralph Murphy is a 52 y.o. male.  Patient presents the ER complaining of low back pain and right hip pain after falling about 4 to 5 feet from a ladder yesterday, states this was for a fall, denies head injury or loss of consciousness, fell directly on his back and hip.  He was able to get up and ambulate after this, today is having more pain, still able to ambulate but states it is painful.  Pain sometimes goes into the right leg.  No numbness tingling, no saddle anesthesia or paresthesia, no bowel or bladder incontinence.  He states this morning he did have some numbness to the right anterior thigh which is now resolved.   Fall       Home Medications Prior to Admission medications   Medication Sig Start Date End Date Taking? Authorizing Provider  HYDROcodone-acetaminophen (NORCO) 5-325 MG tablet Take 1 tablet by mouth every 6 (six) hours as needed for moderate pain. 11/19/22  Yes Graeme Menees A, PA-C  methocarbamol (ROBAXIN) 500 MG tablet Take 1 tablet (500 mg total) by mouth every 6 (six) hours as needed for muscle spasms. 11/19/22  Yes Fredie Majano A, PA-C  naproxen (NAPROSYN) 500 MG tablet Take 1 tablet (500 mg total) by mouth 2 (two) times daily. 11/19/22  Yes Sedrick Tober A, PA-C  esomeprazole (NEXIUM) 20 MG capsule Take 20 mg by mouth daily before breakfast.    [provider]      Allergies    Patient has no known allergies.    Review of Systems   Review of Systems  Physical Exam Updated Vital Signs BP (!) 147/89 (BP Location: Right Arm)   Pulse 69   Temp 98.8 F (37.1 C) (Oral)   Resp 18   Ht 6\' 1"  (1.854 m)   Wt 95.3 kg   SpO2 98%   BMI 27.71 kg/m  Physical Exam Vitals and nursing note reviewed.  Constitutional:      General: He is not in acute distress.     Appearance: He is well-developed.  HENT:     Head: Normocephalic and atraumatic.  Eyes:     Conjunctiva/sclera: Conjunctivae normal.  Cardiovascular:     Rate and Rhythm: Normal rate and regular rhythm.     Heart sounds: No murmur heard. Pulmonary:     Effort: Pulmonary effort is normal. No respiratory distress.     Breath sounds: Normal breath sounds.  Abdominal:     Palpations: Abdomen is soft.     Tenderness: There is no abdominal tenderness.  Musculoskeletal:        General: No swelling.     Cervical back: Full passive range of motion without pain and neck supple. No spinous process tenderness or muscular tenderness.     Comments: Tenderness to right hip and lumbosacral area  Skin:    General: Skin is warm and dry.     Capillary Refill: Capillary refill takes less than 2 seconds.  Neurological:     General: No focal deficit present.     Mental Status: He is alert and oriented to person, place, and time.  Psychiatric:        Mood and Affect: Mood normal.     ED Results / Procedures / Treatments   Labs (  all labs ordered are listed, but only abnormal results are displayed) Labs Reviewed - No data to display  EKG None  Radiology DG Lumbar Spine Complete  Result Date: 11/19/2022 CLINICAL DATA:  Pain after fall. EXAM: LUMBAR SPINE - COMPLETE 4+ VIEW COMPARISON:  None Available. FINDINGS: Normal alignment of the lumbar spine. The vertebral body heights are well preserved. No signs of acute fracture or subluxation. There is mild disc space narrowing and endplate spurring at L4-5. Facet arthropathy is seen at L4-5 and L5-S1. IMPRESSION: 1. No acute findings. 2. Mild degenerative disc disease and facet arthropathy. Electronically Signed   By: Signa Kell M.D.   On: 11/19/2022 14:39   DG Hip Unilat W or Wo Pelvis 2-3 Views Right  Result Date: 11/19/2022 CLINICAL DATA:  Right hip pain after fall yesterday. EXAM: DG HIP (WITH OR WITHOUT PELVIS) 2-3V RIGHT COMPARISON:  None  Available. FINDINGS: There is no evidence of hip fracture or dislocation. There is no evidence of arthropathy or other focal bone abnormality. IMPRESSION: Negative. Electronically Signed   By: Lupita Raider M.D.   On: 11/19/2022 14:37    Procedures Procedures    Medications Ordered in ED Medications  ketorolac (TORADOL) 15 MG/ML injection 15 mg (15 mg Intramuscular Given 11/19/22 1451)  HYDROcodone-acetaminophen (NORCO/VICODIN) 5-325 MG per tablet 1 tablet (1 tablet Oral Given 11/19/22 1450)    ED Course/ Medical Decision Making/ A&P                             Medical Decision Making Ddx: Fracture, sprain, strain, contusion, dislocation,other ED course: Patient presents after a fall from ladder last night having pain to the right low back and hip.  Is able to ambulate he has normal sensation on exam, normal strength in his lower extremities, normal neurovascular status.  He had no head injury or loss of consciousness and is not on blood thinners.  Lumbar spine and right hip and pelvis ordered and viewed and interpreted independently by myself, no fracture or traumatic malalignment noted on plain films of lumbar spine, no fracture or dislocation noted to right hip or pelvis.  Patient was feeling better after analgesics in the ED.  He is able to ambulate without difficulty, discussed home with some medicine for pain, muscle relaxers and have him follow-up close with his primary care doctor.  Discussed the risk of possible nondisplaced fractures that are radiographically occult.  Do not feel further imaging would be helpful today but discussed that he needs to be sure to follow-up and he is in strict return precautions.  He did report some subjective numbness to his right anterior thigh earlier today but has no symptoms of this today.  Amount and/or Complexity of Data Reviewed Radiology: ordered.  Risk Prescription drug management.           Final Clinical Impression(s) / ED  Diagnoses Final diagnoses:  Contusion of lower back, initial encounter    Rx / DC Orders ED Discharge Orders          Ordered    methocarbamol (ROBAXIN) 500 MG tablet  Every 6 hours PRN        11/19/22 1524    naproxen (NAPROSYN) 500 MG tablet  2 times daily        11/19/22 1524    HYDROcodone-acetaminophen (NORCO) 5-325 MG tablet  Every 6 hours PRN        11/19/22 1524  Josem Kaufmann 11/19/22 1528    Gerhard Munch, MD 11/19/22 [phone]

## 2022-11-19 NOTE — Discharge Instructions (Addendum)
It was a pleasure taking care of you today.  Today you were seen for injury to your back and hip after your fall from the ladder yesterday.  Your x-rays did not show any acute fractures or misalignment.  As we discussed sometimes small nondisplaced fractures do not show up on x-rays.  We are treating you with some pain medicine and muscle relaxers.  Is important to follow-up with your primary care doctor and come back to the ER if you have new or worsening symptoms.

## 2022-11-19 NOTE — ED Triage Notes (Signed)
Pt states he fell from ladder yesterday landing on his bottom, pt states he was about 4 foot up the ladder and fell straight back, denies hitting his head.

## 2022-11-24 ENCOUNTER — Ambulatory Visit: Payer: BC Managed Care – PPO

## 2022-12-14 ENCOUNTER — Telehealth: Payer: Self-pay

## 2022-12-14 DIAGNOSIS — R109 Unspecified abdominal pain: Secondary | ICD-10-CM

## 2022-12-14 NOTE — Telephone Encounter (Signed)
Per pt cancel CT scan due to getting a call stating he had to pay 800.00.   Pt was told he needed the CT before having procedure on 12-22-22.  Pt wants to r/s  CT scan now that he knows he can set up a payment plan.He was under the impression he had to pay it all up front is the reason he cancel it. Please call and r/s CT Scan pr pt.

## 2022-12-14 NOTE — Telephone Encounter (Signed)
Left detailed appointment information on VM.    CT scheduled @ [facility] 12/20/22 @ 4:15. Nothing to eat/drink 4 hours prior.

## 2022-12-20 ENCOUNTER — Ambulatory Visit
Admission: RE | Admit: 2022-12-20 | Discharge: 2022-12-20 | Disposition: A | Discharge status: Home / Self Care | Payer: BC Managed Care – PPO | Source: Ambulatory Visit | Attending: Physician Assistant | Admitting: Physician Assistant

## 2022-12-20 DIAGNOSIS — R109 Unspecified abdominal pain: Secondary | ICD-10-CM | POA: Diagnosis not present

## 2022-12-20 DIAGNOSIS — K760 Fatty (change of) liver, not elsewhere classified: Secondary | ICD-10-CM | POA: Diagnosis not present

## 2022-12-20 DIAGNOSIS — K429 Umbilical hernia without obstruction or gangrene: Secondary | ICD-10-CM | POA: Diagnosis not present

## 2022-12-20 LAB — POCT I-STAT CREATININE: Creatinine, Ser: 1.1 mg/dL (ref 0.61–1.24)

## 2022-12-20 MED ORDER — IOHEXOL 300 MG/ML  SOLN
100.0000 mL | Freq: Once | INTRAMUSCULAR | Status: AC | PRN
  Administered 2022-12-20: 100 mL via INTRAVENOUS

## 2022-12-21 ENCOUNTER — Telehealth: Payer: Self-pay

## 2022-12-21 ENCOUNTER — Encounter: Payer: Self-pay | Admitting: Gastroenterology

## 2022-12-21 NOTE — Progress Notes (Signed)
Notify patient abdominal pelvic CT shows no acute abnormality to explain abdominal pain.  There is evidence of fatty liver and aortic atherosclerosis.  No other abnormality.  No evidence of diverticulitis.  Recommend low-fat diet and regular exercise.  Proceed with EGD and colonoscopy as scheduled.

## 2022-12-21 NOTE — Telephone Encounter (Signed)
Notified patient of CT results

## 2022-12-21 NOTE — Progress Notes (Signed)
Normal creatinine kidney test.

## 2022-12-21 NOTE — Telephone Encounter (Signed)
Normal creatinine kidney test. Patient notified. Marland Kitchen

## 2022-12-22 ENCOUNTER — Ambulatory Visit: Payer: BC Managed Care – PPO | Admitting: Certified Registered"

## 2022-12-22 ENCOUNTER — Encounter
Admission: RE | Disposition: A | Discharge status: Home / Self Care | Payer: Self-pay | Source: Home / Self Care | Attending: Gastroenterology

## 2022-12-22 ENCOUNTER — Ambulatory Visit
Admission: RE | Admit: 2022-12-22 | Discharge: 2022-12-22 | Disposition: A | Discharge status: Home / Self Care | Payer: BC Managed Care – PPO | Attending: Gastroenterology | Admitting: Gastroenterology

## 2022-12-22 DIAGNOSIS — D122 Benign neoplasm of ascending colon: Secondary | ICD-10-CM | POA: Diagnosis not present

## 2022-12-22 DIAGNOSIS — K5909 Other constipation: Secondary | ICD-10-CM

## 2022-12-22 DIAGNOSIS — K5900 Constipation, unspecified: Secondary | ICD-10-CM | POA: Diagnosis not present

## 2022-12-22 DIAGNOSIS — D128 Benign neoplasm of rectum: Secondary | ICD-10-CM | POA: Diagnosis not present

## 2022-12-22 DIAGNOSIS — Z87891 Personal history of nicotine dependence: Secondary | ICD-10-CM | POA: Insufficient documentation

## 2022-12-22 DIAGNOSIS — J449 Chronic obstructive pulmonary disease, unspecified: Secondary | ICD-10-CM | POA: Insufficient documentation

## 2022-12-22 DIAGNOSIS — D126 Benign neoplasm of colon, unspecified: Secondary | ICD-10-CM | POA: Diagnosis not present

## 2022-12-22 DIAGNOSIS — K635 Polyp of colon: Secondary | ICD-10-CM | POA: Diagnosis not present

## 2022-12-22 DIAGNOSIS — D124 Benign neoplasm of descending colon: Secondary | ICD-10-CM | POA: Diagnosis not present

## 2022-12-22 DIAGNOSIS — K219 Gastro-esophageal reflux disease without esophagitis: Secondary | ICD-10-CM | POA: Insufficient documentation

## 2022-12-22 DIAGNOSIS — Z1211 Encounter for screening for malignant neoplasm of colon: Secondary | ICD-10-CM | POA: Insufficient documentation

## 2022-12-22 DIAGNOSIS — R109 Unspecified abdominal pain: Secondary | ICD-10-CM

## 2022-12-22 HISTORY — PX: ESOPHAGOGASTRODUODENOSCOPY (EGD) WITH PROPOFOL: SHX5813

## 2022-12-22 HISTORY — PX: SUBMUCOSAL LIFTING INJECTION: SHX6855

## 2022-12-22 HISTORY — PX: HEMOSTASIS CLIP PLACEMENT: SHX6857

## 2022-12-22 HISTORY — PX: COLONOSCOPY WITH PROPOFOL: SHX5780

## 2022-12-22 HISTORY — PX: POLYPECTOMY: SHX5525

## 2022-12-22 SURGERY — COLONOSCOPY WITH PROPOFOL
Anesthesia: General

## 2022-12-22 MED ORDER — MIDAZOLAM HCL 5 MG/5ML IJ SOLN
INTRAMUSCULAR | Status: DC | PRN
  Administered 2022-12-22: 2 mg via INTRAVENOUS

## 2022-12-22 MED ORDER — SODIUM CHLORIDE 0.9 % IV SOLN
INTRAVENOUS | Status: DC
  Administered 2022-12-22: 20 mL/h via INTRAVENOUS

## 2022-12-22 MED ORDER — PROPOFOL 10 MG/ML IV BOLUS
INTRAVENOUS | Status: DC | PRN
  Administered 2022-12-22: 100 mg via INTRAVENOUS

## 2022-12-22 MED ORDER — LIDOCAINE 2% (20 MG/ML) 5 ML SYRINGE
INTRAMUSCULAR | Status: DC | PRN
  Administered 2022-12-22: 20 mg via INTRAVENOUS

## 2022-12-22 MED ORDER — PROPOFOL 500 MG/50ML IV EMUL
INTRAVENOUS | Status: DC | PRN
  Administered 2022-12-22: 120 ug/kg/min via INTRAVENOUS

## 2022-12-22 MED ORDER — GLYCOPYRROLATE 0.2 MG/ML IJ SOLN
INTRAMUSCULAR | Status: AC
  Filled 2022-12-22: qty 1

## 2022-12-22 MED ORDER — MIDAZOLAM HCL 2 MG/2ML IJ SOLN
INTRAMUSCULAR | Status: AC
  Filled 2022-12-22: qty 2

## 2022-12-22 MED ORDER — GLYCOPYRROLATE 0.2 MG/ML IJ SOLN
INTRAMUSCULAR | Status: DC | PRN
  Administered 2022-12-22: .2 mg via INTRAVENOUS

## 2022-12-22 MED ORDER — DEXMEDETOMIDINE HCL IN NACL 80 MCG/20ML IV SOLN
INTRAVENOUS | Status: DC | PRN
  Administered 2022-12-22: 8 ug via INTRAVENOUS

## 2022-12-22 NOTE — Op Note (Signed)
[facility] Gastroenterology Patient Name: Ralph Murphy Procedure Date: 12/22/2022 10:21 AM MRN: [id number] Account #: [id number] Date of Birth: 18-Jan-1971 Admit Type: Outpatient Age: 52 Room: [facility] ENDO ROOM 4 Gender: Male Note Status: Finalized Instrument Name: Nelda Marseille [id number] Procedure:             Colonoscopy Indications:           Screening for colorectal malignant neoplasm Providers:             Wyline Mood MD, MD Referring MD:          [name] (Referring MD) Medicines:             Monitored Anesthesia Care Complications:         No immediate complications. Procedure:             Pre-Anesthesia Assessment:                        - Prior to the procedure, a History and Physical was                         performed, and patient medications, allergies and                         sensitivities were reviewed. The patient's tolerance                         of previous anesthesia was reviewed.                        - The risks and benefits of the procedure and the                         sedation options and risks were discussed with the                         patient. All questions were answered and informed                         consent was obtained.                        - ASA Grade Assessment: II - A patient with mild                         systemic disease.                        After obtaining informed consent, the colonoscope was                         passed under direct vision. Throughout the procedure,                         the patient's blood pressure, pulse, and oxygen                         saturations were monitored continuously. The                         Colonoscope was introduced through  the anus and                         advanced to the the cecum, identified by the                         appendiceal orifice. The colonoscopy was performed                         with ease. The patient tolerated the procedure well.                          The quality of the bowel preparation was excellent.                         The ileocecal valve, appendiceal orifice, and rectum                         were photographed. Findings:      The perianal and digital rectal examinations were normal.      A 3 mm polyp was found in the rectum. The polyp was sessile. The polyp       was removed with a jumbo cold forceps. Resection and retrieval were       complete.      A 20 mm polyp was found in the rectum. The polyp was sessile.       Preparations were made for mucosal resection. Demarcation of the lesion       was performed with narrow band imaging to clearly identify the       boundaries of the lesion. Saline was injected to raise the lesion. Snare       mucosal resection was performed. Resection and retrieval were complete.       Resected tissue margins were examined and clear of polyp tissue. To       prevent bleeding after mucosal resection, one hemostatic clip was       successfully placed. There was no bleeding at the end of the procedure.      A 17 mm polyp was found in the ascending colon. The polyp was sessile.       Preparations were made for mucosal resection. Demarcation of the lesion       was performed with narrow band imaging to clearly identify the       boundaries of the lesion. Saline was injected to raise the lesion. Snare       mucosal resection was performed. Resection and retrieval were complete.       Resected tissue margins were examined and clear of polyp tissue. To       prevent bleeding after mucosal resection, one hemostatic clip was       successfully placed. There was no bleeding during, or at the end, of the       procedure.      A 6 mm polyp was found in the descending colon. The polyp was sessile.       The polyp was removed with a cold snare. Resection and retrieval were       complete.      The exam was otherwise without abnormality on direct and retroflexion       views. Impression:            -  One 3 mm polyp in the rectum, removed with a jumbo                         cold forceps. Resected and retrieved.                        - One 20 mm polyp in the rectum, removed with mucosal                         resection. Resected and retrieved. Clip was placed.                        - One 17 mm polyp in the ascending colon, removed with                         mucosal resection. Resected and retrieved. Clip was                         placed.                        - One 6 mm polyp in the descending colon, removed with                         a cold snare. Resected and retrieved.                        - The examination was otherwise normal on direct and                         retroflexion views.                        - Mucosal resection was performed. Resection and                         retrieval were complete.                        - Mucosal resection was performed. Resection and                         retrieval were complete. Recommendation:        - Discharge patient to home (with escort).                        - Resume previous diet.                        - Continue present medications.                        - Await pathology results.                        - Repeat colonoscopy for surveillance based on                         pathology results. Procedure Code(s):     --- Professional ---  51884, Colonoscopy, flexible; with endoscopic mucosal                         resection                        45385, 59, Colonoscopy, flexible; with removal of                         tumor(s), polyp(s), or other lesion(s) by snare                         technique                        45380, 59, Colonoscopy, flexible; with biopsy, single                         or multiple Diagnosis Code(s):     --- Professional ---                        D12.8, Benign neoplasm of rectum                        D12.2, Benign neoplasm of ascending colon                         Z12.11, Encounter for screening for malignant neoplasm                         of colon                        D12.4, Benign neoplasm of descending colon CPT copyright 2022 American Medical Association. All rights reserved. The codes documented in this report are preliminary and upon coder review may  be revised to meet current compliance requirements. Wyline Mood, MD Wyline Mood MD, MD 12/22/2022 11:02:04 AM This report has been signed electronically. Number of Addenda: 0 Note Initiated On: 12/22/2022 10:21 AM Scope Withdrawal Time: 0 hours 19 minutes 16 seconds  Total Procedure Duration: 0 hours 20 minutes 54 seconds  Estimated Blood Loss:  Estimated blood loss: none.      [facility]

## 2022-12-22 NOTE — Anesthesia Preprocedure Evaluation (Signed)
Anesthesia Evaluation  Patient identified by MRN, date of birth, ID band Patient awake    Reviewed: Allergy & Precautions, NPO status , Patient's Chart, lab work & pertinent test results  Airway Mallampati: II  TM Distance: >3 FB Neck ROM: Full    Dental  (+) Teeth Intact   Pulmonary neg pulmonary ROS, COPD, Patient abstained from smoking., former smoker   Pulmonary exam normal        Cardiovascular Exercise Tolerance: Good negative cardio ROS Normal cardiovascular exam Rhythm:Regular Rate:Normal     Neuro/Psych negative neurological ROS  negative psych ROS   GI/Hepatic negative GI ROS, Neg liver ROS,GERD  Medicated,,  Endo/Other  negative endocrine ROS    Renal/GU negative Renal ROS  negative genitourinary   Musculoskeletal   Abdominal Normal abdominal exam  (+)   Peds negative pediatric ROS (+)  Hematology negative hematology ROS (+)   Anesthesia Other Findings Past Medical History: No date: GERD (gastroesophageal reflux disease)  Past Surgical History: No date: KNEE SURGERY No date: THROAT SURGERY  BMI    Body Mass Index: 27.47 kg/m      Reproductive/Obstetrics negative OB ROS                             Anesthesia Physical Anesthesia Plan  ASA: 2  Anesthesia Plan: General   Post-op Pain Management:    Induction: Intravenous  PONV Risk Score and Plan: Propofol infusion and TIVA  Airway Management Planned: Natural Airway  Additional Equipment:   Intra-op Plan:   Post-operative Plan:   Informed Consent: I have reviewed the patients History and Physical, chart, labs and discussed the procedure including the risks, benefits and alternatives for the proposed anesthesia with the patient or authorized representative who has indicated his/her understanding and acceptance.     Dental Advisory Given  Plan Discussed with: CRNA and Surgeon  Anesthesia Plan Comments:         Anesthesia Quick Evaluation

## 2022-12-22 NOTE — Anesthesia Postprocedure Evaluation (Signed)
Anesthesia Post Note  Patient: Ralph Murphy  Procedure(s) Performed: COLONOSCOPY WITH PROPOFOL ESOPHAGOGASTRODUODENOSCOPY (EGD) WITH PROPOFOL SUBMUCOSAL LIFTING INJECTION HEMOSTASIS CLIP PLACEMENT  Patient location during evaluation: PACU Anesthesia Type: General Level of consciousness: awake and awake and alert Pain management: satisfactory to patient Vital Signs Assessment: post-procedure vital signs reviewed and stable Respiratory status: spontaneous breathing Cardiovascular status: blood pressure returned to baseline Anesthetic complications: no   No notable events documented.   Last Vitals:  Vitals:   12/22/22 0956 12/22/22 1103  BP: 132/80 104/61  Pulse: 62 66  Resp: 20 18  Temp: (!) 35.7 C (!) 35.5 C  SpO2: 100% 96%    Last Pain:  Vitals:   12/22/22 1103  TempSrc: Temporal  PainSc: Asleep                 [place]

## 2022-12-22 NOTE — Transfer of Care (Signed)
Immediate Anesthesia Transfer of Care Note  Patient: Ralph Murphy  Procedure(s) Performed: COLONOSCOPY WITH PROPOFOL ESOPHAGOGASTRODUODENOSCOPY (EGD) WITH PROPOFOL SUBMUCOSAL LIFTING INJECTION HEMOSTASIS CLIP PLACEMENT  Patient Location: Endoscopy Unit  Anesthesia Type:General  Level of Consciousness: drowsy  Airway & Oxygen Therapy: Patient Spontanous Breathing  Post-op Assessment: Report given to RN and Post -op Vital signs reviewed and stable  Post vital signs: Reviewed  Last Vitals:  Vitals Value Taken Time  BP 106/71   Temp    Pulse 66   Resp 16   SpO2 99     Last Pain:         Complications: No notable events documented.

## 2022-12-22 NOTE — Op Note (Signed)
[facility] Gastroenterology Patient Name: Ralph Murphy Procedure Date: 12/22/2022 10:23 AM MRN: [id number] Account #: [id number] Date of Birth: 1970/12/12 Admit Type: Outpatient Age: 52 Room: [facility] ENDO ROOM 4 Gender: Male Note Status: Finalized Instrument Name: Upper Endoscope [id number] Procedure:             Upper GI endoscopy Indications:           Follow-up of esophageal reflux Providers:             Wyline Mood MD, MD Referring MD:          [name] (Referring MD) Medicines:             Monitored Anesthesia Care Complications:         No immediate complications. Procedure:             Pre-Anesthesia Assessment:                        - Prior to the procedure, a History and Physical was                         performed, and patient medications, allergies and                         sensitivities were reviewed. The patient's tolerance                         of previous anesthesia was reviewed.                        - The risks and benefits of the procedure and the                         sedation options and risks were discussed with the                         patient. All questions were answered and informed                         consent was obtained.                        - ASA Grade Assessment: II - A patient with mild                         systemic disease.                        After obtaining informed consent, the endoscope was                         passed under direct vision. Throughout the procedure,                         the patient's blood pressure, pulse, and oxygen                         saturations were monitored continuously. The Endoscope                         was  introduced through the mouth, and advanced to the                         third part of duodenum. The upper GI endoscopy was                         accomplished with ease. The patient tolerated the                         procedure well. Findings:      The esophagus  was normal.      The stomach was normal.      The examined duodenum was normal. Impression:            - Normal esophagus.                        - Normal stomach.                        - Normal examined duodenum.                        - No specimens collected. Recommendation:        - Perform a colonoscopy today. Procedure Code(s):     --- Professional ---                        [id number], Esophagogastroduodenoscopy, flexible,                         transoral; diagnostic, including collection of                         specimen(s) by brushing or washing, when performed                         (separate procedure) Diagnosis Code(s):     --- Professional ---                        K21.9, Gastro-esophageal reflux disease without                         esophagitis CPT copyright 2022 American Medical Association. All rights reserved. The codes documented in this report are preliminary and upon coder review may  be revised to meet current compliance requirements. Wyline Mood, MD Wyline Mood MD, MD 12/22/2022 10:36:24 AM This report has been signed electronically. Number of Addenda: 0 Note Initiated On: 12/22/2022 10:23 AM Estimated Blood Loss:  Estimated blood loss: none.      [facility]

## 2022-12-22 NOTE — H&P (Signed)
Wyline Mood, MD [address], [address], [place], [place], [address] [address], [place], [place], [address] Phone: [phone]  Fax: [phone]  Primary Care Physician:  [name], PA-C   Pre-Procedure History & Physical: HPI:  Ralph Murphy is a 52 y.o. male is here for an endoscopy and colonoscopy    Past Medical History:  Diagnosis Date   GERD (gastroesophageal reflux disease)     Past Surgical History:  Procedure Laterality Date   KNEE SURGERY     THROAT SURGERY      Prior to Admission medications   Medication Sig Start Date End Date Taking? Authorizing Provider  esomeprazole (NEXIUM) 20 MG capsule Take 20 mg by mouth daily before breakfast.    [provider]  HYDROcodone-acetaminophen (NORCO) 5-325 MG tablet Take 1 tablet by mouth every 6 (six) hours as needed for moderate pain. 11/19/22   Carmel Sacramento A, PA-C  methocarbamol (ROBAXIN) 500 MG tablet Take 1 tablet (500 mg total) by mouth every 6 (six) hours as needed for muscle spasms. 11/19/22   Carmel Sacramento A, PA-C  naproxen (NAPROSYN) 500 MG tablet Take 1 tablet (500 mg total) by mouth 2 (two) times daily. 11/19/22   Carmel Sacramento A, PA-C    Allergies as of 10/20/2022   (No Known Allergies)    Family History  Problem Relation Age of Onset   Colon cancer Father     Social History   Socioeconomic History   Marital status: Single    Spouse name: Not on file   Number of children: Not on file   Years of education: Not on file   Highest education level: Not on file  Occupational History   Not on file  Tobacco Use   Smoking status: Former    Current packs/day: 1.00    Types: Cigarettes   Smokeless tobacco: Never   Tobacco comments:    Quit smoking ?   Vaping Use   Vaping status: Never Used  Substance and Sexual Activity   Alcohol use: Not Currently    Comment: stopped 10/2017   Drug use: Yes    Types: Marijuana    Comment: daily   Sexual activity: Yes  Other  Topics Concern   Not on file  Social History Narrative   Not on file   Social Determinants of Health   Financial Resource Strain: Not on file  Food Insecurity: Not on file  Transportation Needs: Not on file  Physical Activity: Not on file  Stress: Not on file  Social Connections: Not on file  Intimate Partner Violence: Not on file    Review of Systems: See HPI, otherwise negative ROS  Physical Exam: There were no vitals taken for this visit. General:   Alert,  pleasant and cooperative in NAD Head:  Normocephalic and atraumatic. Neck:  Supple; no masses or thyromegaly. Lungs:  Clear throughout to auscultation, normal respiratory effort.    Heart:  +S1, +S2, Regular rate and rhythm, No edema. Abdomen:  Soft, nontender and nondistended. Normal bowel sounds, without guarding, and without rebound.   Neurologic:  Alert and  oriented x4;  grossly normal neurologically.  Impression/Plan: Ralph Murphy is here for an endoscopy and colonoscopy  to be performed for  evaluation of GERD and colon cancer screening     Risks, benefits, limitations, and alternatives regarding endoscopy have been reviewed with the patient.  Questions have been answered.  All parties agreeable.   Wyline Mood, MD  12/22/2022, 9:40 AM

## 2022-12-25 ENCOUNTER — Encounter: Payer: Self-pay | Admitting: Gastroenterology

## 2022-12-27 NOTE — Progress Notes (Signed)
Thank you   Ralph Murphy 

## 2022-12-27 NOTE — Progress Notes (Signed)
This is Ralph Murphy from GI , just checking to see if the margins of the large polyps taken out were clear of adenomatous tissue .   Sharlet Salina

## 2023-01-02 ENCOUNTER — Encounter: Payer: Self-pay | Admitting: Gastroenterology

## 2023-01-03 ENCOUNTER — Telehealth: Payer: Self-pay

## 2023-01-03 NOTE — Telephone Encounter (Signed)
Pt want to know if he can have his procedure results before his appt on 02-02-2023? He sate he was told he would get results as soon as they were resulted.

## 2023-01-03 NOTE — Telephone Encounter (Signed)
Spoke with patient to let him know the below results.                                                                             Also placed in recall box.  Dear Ralph Murphy,   The polyps removed from your colon was benign, but precancerous. This means that it had the potential to change into cancer over time had it not been removed.   I recommend you have a repeat colonoscopy in 3 years to determine if you have developed any new polyps and to screen for colorectal cancer.    If you develop any new rectal bleeding, abdominal pain or significant bowel habit changes, please contact our office at [phone].   Please call us if you have persistent problems or have questions about your condition that have not been fully answered at this time.   Sincerely,   Wyline Mood, MD

## 2023-01-09 ENCOUNTER — Telehealth: Payer: Self-pay

## 2023-01-09 MED ORDER — LINACLOTIDE 145 MCG PO CAPS: 145.0000 ug | ORAL_CAPSULE | Freq: Every day | ORAL | 3 refills | Status: DC

## 2023-01-09 MED ORDER — LIDOCAINE (ANORECTAL) 5 % EX CREA: 1.0000 | TOPICAL_CREAM | Freq: Three times a day (TID) | CUTANEOUS | 0 refills | Status: DC

## 2023-01-09 NOTE — Telephone Encounter (Signed)
Patient had colonoscopy and EGD  done on 12/22/2022 since pinching and tearing pain in her his rectum. The pain is constant and is worse with bowel movements. Denies any rectal bleeding. Had extreme gas and constipation on Sunday, Monday woke up with extreme diarrhea that lasted all day. He states he went 4 times yesterday. Denies any diarrhea today but today his stool is soft. States last week his stool was not normal  The rectal pain is worse today then it has been the whole time since He has been out of work for 3 days now because of the pain and his GI issues and wants to know what you recommend.

## 2023-01-09 NOTE — Telephone Encounter (Signed)
[name] you have seen him

## 2023-01-09 NOTE — Telephone Encounter (Signed)
Spoke with Patient- he stated the Linzess was never called in to pharmacy- so he has not been taking it- he was instructed to take Miralax and colace stool softeners daily- I let him know I would send both the Linzess and Recticream to pharmacy -he has been out of work for the past 3 days with his bowels. He stated he had explosive diarrhea yesterday and rectal pain to the point where he feels he is unable to lift and climb ladders. He is requesting a work note for the last three days. He has OV scheduled 9/5/ @ 11:15. When I call him back I will offer Linzess samples.   Recent colonoscopy showed several large polyps removed.  Patient has history of chronic constipation.  I treated him with Linzess 145 mcg once daily at his last OV.  Is he still taking Linzess?  Is he having constipation?  Rectal bleeding?  I recommend: 1.  Schedule OV with me, next available (11:15am workin [place]). 2.  Take treatment for constipation every day (Linzess, MiraLAX, Colace stool softener) 3.  Use OTC RectiCare lidocaine cream 3 times daily to the rectum for rectal pain. 4.  Take extra strength Tylenol to twice daily to help with pain. 5.  Recommend warm water sitz bath with Epsom salt to help with pain. [name], PA-C

## 2023-01-09 NOTE — Telephone Encounter (Signed)
Spoke with patient-he is coming to get Linzess samples- return to work note.

## 2023-01-31 ENCOUNTER — Ambulatory Visit: Payer: BC Managed Care – PPO | Admitting: Physician Assistant

## 2023-02-01 NOTE — Progress Notes (Signed)
Ralph Amy, PA-C [address]  [address]  [place], [place] [address]  Main: [phone]  Fax: [phone]   Primary Care Physician: Ralph Ferguson, PA-C  Primary Gastroenterologist:  Ralph Amy, PA-C / Dr. Wyline Murphy    CC: F/U LLQ pain, constipation, GERD, family history of colon cancer  HPI: Ralph Murphy is a 52 y.o. male returns for 8-month follow-up of LLQ pain, constipation, GERD, family history of colon cancer (father age 26).  3 months ago he was started on Linzess 145 mcg daily.  He could not tolerate Linzess which caused a lot of diarrhea.  Currently takes 3 Colace stool softener every morning, MiraLAX every evening, and 2 Metamucil daily.  On this treatment his constipation has improved.  He is having normal soft bowel movement every morning.  He continues taking Nexium 20 mg daily or every other day and his acid reflux has improved.  He continues to have intermittent left-sided abdominal cramping which comes and goes for many years.  It is constant 4 out of 10.  Increases to 6-8 out of 10 after eating.  Relieved after bowel movement.  He has not tried dicyclomine or antispasmodic.  Recent CT and colonoscopy were unrevealing for sources of LLQ abdominal pain.  Abdominal pelvic CT with contrast 12/20/2022: Fatty liver, otherwise normal.  No diverticulitis.    Labs 09/2022 showed normal CBC and CMP.  Normal LFTs.  Colonoscopy 12/22/2022: 20 mm sessile rectal polyp, 3 mm rectal polyp, 17 mm ascending colon polyp, 6 mm descending colon polyp removed.  Pathology showed 3 tubular adenomas and 1 hyperplastic polyp.  No dysplasia.  Excellent prep.  3-year repeat.  EGD 12/22/2022: Normal.  Current Outpatient Medications  Medication Sig Dispense Refill   dicyclomine (BENTYL) 10 MG capsule Take 1 capsule (10 mg total) by mouth 3 (three) times daily before meals. 90 capsule 5   docusate sodium (COLACE) 100 MG capsule Take 100 mg by mouth 2 (two) times daily.     esomeprazole  (NEXIUM) 20 MG capsule Take 20 mg by mouth daily before breakfast.     HYDROcodone-acetaminophen (NORCO) 5-325 MG tablet Take 1 tablet by mouth every 6 (six) hours as needed for moderate pain. 8 tablet 0   Lidocaine, Anorectal, 5 % CREA Apply 1 Application topically 3 (three) times daily. 28 g 0   methocarbamol (ROBAXIN) 500 MG tablet Take 1 tablet (500 mg total) by mouth every 6 (six) hours as needed for muscle spasms. 20 tablet 0   naproxen (NAPROSYN) 500 MG tablet Take 1 tablet (500 mg total) by mouth 2 (two) times daily. 10 tablet 0   polyethylene glycol (MIRALAX / GLYCOLAX) 17 g packet Take 17 g by mouth daily.     psyllium (METAMUCIL) 58.6 % packet Take 1 packet by mouth daily.     testosterone cypionate (DEPOTESTOSTERONE CYPIONATE) 200 MG/ML injection Inject into the muscle every 14 (fourteen) days.     No current facility-administered medications for this visit.    Allergies as of 02/02/2023   (No Known Allergies)    Past Medical History:  Diagnosis Date   GERD (gastroesophageal reflux disease)     Past Surgical History:  Procedure Laterality Date   COLONOSCOPY WITH PROPOFOL N/A 12/22/2022   Procedure: COLONOSCOPY WITH PROPOFOL;  Surgeon: Ralph Mood, MD;  Location[facility] ENDOSCOPY;  Service: Gastroenterology;  Laterality: N/A;   ESOPHAGOGASTRODUODENOSCOPY (EGD) WITH PROPOFOL N/A 12/22/2022   Procedure: ESOPHAGOGASTRODUODENOSCOPY (EGD) WITH PROPOFOL;  Surgeon: Ralph Mood, MD;  Location:  [facility] ENDOSCOPY;  Service: Gastroenterology;  Laterality: N/A;   HEMOSTASIS CLIP PLACEMENT  12/22/2022   Procedure: HEMOSTASIS CLIP PLACEMENT;  Surgeon: Ralph Mood, MD;  Location: [facility] ENDOSCOPY;  Service: Gastroenterology;;   KNEE SURGERY     POLYPECTOMY  12/22/2022   Procedure: POLYPECTOMY;  Surgeon: Ralph Mood, MD;  Location: [facility] ENDOSCOPY;  Service: Gastroenterology;;   Sunnie Nielsen LIFTING INJECTION  12/22/2022   Procedure: SUBMUCOSAL LIFTING INJECTION;  Surgeon: Ralph Mood, MD;  Location:  [facility] ENDOSCOPY;  Service: Gastroenterology;;   THROAT SURGERY      Review of Systems:    All systems reviewed and negative except where noted in HPI.   Physical Examination:   BP 130/77   Pulse 75   Temp 98.2 F (36.8 C)   Ht 6\' 1"  (1.854 m)   Wt 208 lb 12.8 oz (94.7 kg)   BMI 27.55 kg/m   General: Well-nourished, well-developed in no acute distress.  Neuro: Alert and oriented x 3.  Grossly intact.  Psych: Alert and cooperative, normal Murphy and affect. No exam performed.  Imaging Studies: No results found.  Assessment and Plan:   Ralph Murphy is a 52 y.o. y/o male returns for follow-up of chronic constipation, GERD, LLQ pain.  Recent abdominal pelvic CT showed fatty liver, otherwise no acute abnormality.  No diverticulitis.  Recent EGD was normal.  Recent colonoscopy showed 2 large and 1 small adenomatous polyp removed.  No dysplasia.  Repeat colonoscopy in 3 years.  1.  Chronic constipation -improved  Continue OTC Colace, MiraLAX, and Metamucil daily.  2.  Left Side Pain - Most consistent with Colon Spasm / IBS-C  Trial of Dicyclomine 10mg  1 tablet 3 times daily as needed.   He can try try taking dicyclomine 10 Mg 2 tablets every day before bedtime. Reassurance regarding recent abdominal pelvic CT and colonoscopy results.  2.  Chronic GERD  Continue Nexium 20 Mg daily.  3.  History of large adenomatous colon polyps  Repeat colonoscopy in 3 years (11/2025).  4.  Family history of colon cancer (father age 51).  5.  Hepatic steatosis -normal LFTs 09/2022  Recommend a low-fat diet, and regular exercise. Patient education handout about fatty liver disease was given and discussed from up-to-date.     Ralph Amy, PA-C  Follow up in 6 months with Ralph Murphy.

## 2023-02-02 ENCOUNTER — Ambulatory Visit (INDEPENDENT_AMBULATORY_CARE_PROVIDER_SITE_OTHER): Payer: BC Managed Care – PPO | Admitting: Physician Assistant

## 2023-02-02 ENCOUNTER — Encounter: Payer: Self-pay | Admitting: Physician Assistant

## 2023-02-02 VITALS — BP 130/77 | HR 75 | Temp 98.2°F | Ht 73.0 in | Wt 208.8 lb

## 2023-02-02 DIAGNOSIS — K219 Gastro-esophageal reflux disease without esophagitis: Secondary | ICD-10-CM | POA: Diagnosis not present

## 2023-02-02 DIAGNOSIS — K760 Fatty (change of) liver, not elsewhere classified: Secondary | ICD-10-CM

## 2023-02-02 DIAGNOSIS — Z86010 Personal history of colonic polyps: Secondary | ICD-10-CM

## 2023-02-02 DIAGNOSIS — R1032 Left lower quadrant pain: Secondary | ICD-10-CM | POA: Diagnosis not present

## 2023-02-02 DIAGNOSIS — K5909 Other constipation: Secondary | ICD-10-CM | POA: Diagnosis not present

## 2023-02-02 DIAGNOSIS — Z800 Family history of malignant neoplasm of digestive organs: Secondary | ICD-10-CM

## 2023-02-02 DIAGNOSIS — K581 Irritable bowel syndrome with constipation: Secondary | ICD-10-CM

## 2023-02-02 MED ORDER — DICYCLOMINE HCL 10 MG PO CAPS: 10.0000 mg | ORAL_CAPSULE | Freq: Three times a day (TID) | ORAL | 5 refills | Status: DC

## 2023-07-04 ENCOUNTER — Other Ambulatory Visit: Payer: Self-pay | Admitting: Medical Genetics

## 2023-07-12 ENCOUNTER — Emergency Department: Payer: BC Managed Care – PPO

## 2023-07-12 ENCOUNTER — Emergency Department
Admission: EM | Admit: 2023-07-12 | Discharge: 2023-07-12 | Disposition: A | Discharge status: Home / Self Care | Payer: BC Managed Care – PPO | Attending: Emergency Medicine | Admitting: Emergency Medicine

## 2023-07-12 ENCOUNTER — Encounter: Payer: Self-pay | Admitting: *Deleted

## 2023-07-12 ENCOUNTER — Other Ambulatory Visit: Payer: Self-pay

## 2023-07-12 DIAGNOSIS — M546 Pain in thoracic spine: Secondary | ICD-10-CM | POA: Diagnosis not present

## 2023-07-12 DIAGNOSIS — M47814 Spondylosis without myelopathy or radiculopathy, thoracic region: Secondary | ICD-10-CM | POA: Diagnosis not present

## 2023-07-12 MED ORDER — PREDNISONE 10 MG PO TABS: 10.0000 mg | ORAL_TABLET | Freq: Every day | ORAL | 0 refills | Status: DC

## 2023-07-12 MED ORDER — HYDROCODONE-ACETAMINOPHEN 5-325 MG PO TABS
1.0000 | ORAL_TABLET | ORAL | 0 refills | Status: DC | PRN
Start: 2023-07-12 — End: 2023-09-14

## 2023-07-12 NOTE — ED Notes (Signed)
See triage note   Presents with mid back pain  Denies nay fall   but states he had been carrying some thing heavy for couple of days

## 2023-07-12 NOTE — ED Provider Triage Note (Signed)
Emergency Medicine Provider Triage Evaluation Note  Ralph Murphy , a 53 y.o. male  was evaluated in triage.  Pt complains of mid back pain. No recent injury. Denies urinary sx. Denies recent illnesses and weakness. Hx of back pain   Review of Systems  Positive:  Negative:   Physical Exam  BP (!) 141/103 (BP Location: Left Arm)   Pulse 78   Temp 98.9 F (37.2 C) (Oral)   Resp 20   Ht 6' (1.829 m)   Wt 98 kg   SpO2 98%   BMI 29.29 kg/m  Gen:   Awake, no distress   Resp:  Normal effort  MSK:   Moves extremities without difficulty  Other:    Medical Decision Making  Medically screening exam initiated at 4:40 PM.  Appropriate orders placed.  Ralph Murphy was informed that the remainder of the evaluation will be completed by another provider, this initial triage assessment does not replace that evaluation, and the importance of remaining in the ED until their evaluation is complete.    Romeo Apple, Miyoko Hashimi A, PA-C 07/12/23 1642

## 2023-07-12 NOTE — ED Provider Notes (Signed)
   [facility] Provider Note    Event Date/Time   First MD Initiated Contact with Patient 07/12/23 1754     (approximate)  History   Chief Complaint: Back Pain  HPI  Ralph Murphy is a 53 y.o. male with a past medical history of back pain who presents to the emergency department for worsening back pain.  According to the patient on Monday (3 days ago) he was working under a crawl space of a house when he accidentally exacerbated his back pain.  Patient states a long history of back pain, states it is usually tolerable however over the past couple days it has been much worse so he came to the emergency department for evaluation.  Patient denies any weakness or numbness of either leg denies any incontinence.  Patient states he missed work yesterday and today due to the back pain so he came to the emergency department.  Physical Exam   Triage Vital Signs: ED Triage Vitals [07/12/23 1638]  Encounter Vitals Group     BP (!) 141/103     Systolic BP Percentile      Diastolic BP Percentile      Pulse Rate 78     Resp 20     Temp 98.9 F (37.2 C)     Temp Source Oral     SpO2 98 %     Weight 216 lb (98 kg)     Height 6' (1.829 m)     Head Circumference      Peak Flow      Pain Score 8     Pain Loc      Pain Education      Exclude from Growth Chart     Most recent vital signs: Vitals:   07/12/23 1638  BP: (!) 141/103  Pulse: 78  Resp: 20  Temp: 98.9 F (37.2 C)  SpO2: 98%    General: Awake, no distress.  CV:  Good peripheral perfusion.  Resp:  Normal effort.  Abd:  No distention.   ED Results / Procedures / Treatments   RADIOLOGY  I reviewed and interpreted the x-ray images.  No obvious fracture or malalignment seen on my evaluation. Radiology is read the x-ray as mild spondylosis otherwise no acute process   MEDICATIONS ORDERED IN ED: Medications - No data to display   IMPRESSION / MDM / ASSESSMENT AND PLAN / ED COURSE  I reviewed the  triage vital signs and the nursing notes.  Patient's presentation is most consistent with acute presentation with potential threat to life or bodily function.  Patient presents to the emergency department for back pain exacerbated on Monday when crawling under a house.  No red flags on exam, denies any weakness or numbness no incontinence.  Symptoms seem suggestive of more muscular back pain however he states it is somewhat worse with certain movements of the arm or he can feel tingling at times, possibly indicative of more neuropathic pain.  Will treat the patient with a prednisone taper short course of pain medication have the patient follow-up with his doctor.  Patient agreeable to plan of care.  Provided my typical back pain return precautions.  FINAL CLINICAL IMPRESSION(S) / ED DIAGNOSES   Back pain   Note:  This document was prepared using Dragon voice recognition software and may include unintentional dictation errors.   Minna Antis, MD 07/12/23 1850

## 2023-07-12 NOTE — ED Triage Notes (Signed)
Pt ambulatory to triage.  Pt has mid back pain.  No recent injury.  Pt reports carrying a ladder every day at work   pt is a cable guy.  No denies urinary sx.  Pt alert.   Speech clear.

## 2023-07-23 DIAGNOSIS — G8929 Other chronic pain: Secondary | ICD-10-CM | POA: Diagnosis not present

## 2023-07-23 DIAGNOSIS — M25551 Pain in right hip: Secondary | ICD-10-CM | POA: Diagnosis not present

## 2023-07-23 DIAGNOSIS — M5441 Lumbago with sciatica, right side: Secondary | ICD-10-CM | POA: Diagnosis not present

## 2023-07-23 DIAGNOSIS — M5416 Radiculopathy, lumbar region: Secondary | ICD-10-CM | POA: Diagnosis not present

## 2023-07-24 ENCOUNTER — Other Ambulatory Visit: Payer: Self-pay | Admitting: Physical Medicine & Rehabilitation

## 2023-07-24 DIAGNOSIS — M5416 Radiculopathy, lumbar region: Secondary | ICD-10-CM

## 2023-07-30 ENCOUNTER — Ambulatory Visit
Admission: RE | Admit: 2023-07-30 | Discharge: 2023-07-30 | Disposition: A | Discharge status: Home / Self Care | Payer: BC Managed Care – PPO | Source: Ambulatory Visit | Attending: Physical Medicine & Rehabilitation | Admitting: Physical Medicine & Rehabilitation

## 2023-07-30 DIAGNOSIS — M5416 Radiculopathy, lumbar region: Secondary | ICD-10-CM

## 2023-08-06 ENCOUNTER — Ambulatory Visit: Payer: BC Managed Care – PPO | Admitting: Physician Assistant

## 2023-08-06 NOTE — Progress Notes (Deleted)
 [name], PA-C [address]  [address]  [place], [place] [address]  Main: [phone]  Fax: [phone]   Primary Care Physician: [name], PA-C  Primary Gastroenterologist:  ***  CC:  HPI: Ralph Murphy is a 53 y.o. male returns for 25-month follow-up of chronic constipation, left side pain, IBS-C, chronic GERD, hepatic steatosis.  He has history of large adenomatous colon polyps and family history of colon cancer in his father age 48.  He will be due for 3-year repeat colonoscopy 11/2025.  Current symptoms: He is currently taking Colace stool softener, MiraLAX, and Metamucil for constipation.  Nexium 20 Mg once daily for GERD.  Low-fat diet and regular exercise for fatty liver disease.  Colonoscopy 12/22/2022: 20 mm sessile rectal polyp, 3 mm rectal polyp, 17 mm ascending colon polyp, 6 mm descending colon polyp removed.  Pathology showed 3 tubular adenomas and 1 hyperplastic polyp.  No dysplasia.  Excellent prep.  3-year repeat.   EGD 12/22/2022: Normal.  Abdominal pelvic CT with contrast 12/20/2022: Fatty liver, otherwise normal.  No diverticulitis.   Current Outpatient Medications  Medication Sig Dispense Refill   dicyclomine (BENTYL) 10 MG capsule Take 1 capsule (10 mg total) by mouth 3 (three) times daily before meals. 90 capsule 5   docusate sodium (COLACE) 100 MG capsule Take 100 mg by mouth 2 (two) times daily.     esomeprazole (NEXIUM) 20 MG capsule Take 20 mg by mouth daily before breakfast.     HYDROcodone-acetaminophen (NORCO/VICODIN) 5-325 MG tablet Take 1 tablet by mouth every 4 (four) hours as needed. 10 tablet 0   Lidocaine, Anorectal, 5 % CREA Apply 1 Application topically 3 (three) times daily. 28 g 0   methocarbamol (ROBAXIN) 500 MG tablet Take 1 tablet (500 mg total) by mouth every 6 (six) hours as needed for muscle spasms. 20 tablet 0   naproxen (NAPROSYN) 500 MG tablet Take 1 tablet (500 mg total) by mouth 2 (two) times daily. 10 tablet 0    polyethylene glycol (MIRALAX / GLYCOLAX) 17 g packet Take 17 g by mouth daily.     predniSONE (DELTASONE) 10 MG tablet Take 1 tablet (10 mg total) by mouth daily. Day 1-3: take 4 tablets PO daily Day 4-6: take 3 tablets PO daily Day 7-9: take 2 tablets PO daily Day 10-12: take 1 tablet PO daily 30 tablet 0   psyllium (METAMUCIL) 58.6 % packet Take 1 packet by mouth daily.     testosterone cypionate (DEPOTESTOSTERONE CYPIONATE) 200 MG/ML injection Inject into the muscle every 14 (fourteen) days.     No current facility-administered medications for this visit.    Allergies as of 08/06/2023   (No Known Allergies)    Past Medical History:  Diagnosis Date   GERD (gastroesophageal reflux disease)     Past Surgical History:  Procedure Laterality Date   COLONOSCOPY WITH PROPOFOL N/A 12/22/2022   Procedure: COLONOSCOPY WITH PROPOFOL;  Surgeon: Wyline Mood, MD;  Location: [facility] ENDOSCOPY;  Service: Gastroenterology;  Laterality: N/A;   ESOPHAGOGASTRODUODENOSCOPY (EGD) WITH PROPOFOL N/A 12/22/2022   Procedure: ESOPHAGOGASTRODUODENOSCOPY (EGD) WITH PROPOFOL;  Surgeon: Wyline Mood, MD;  Location: [facility] ENDOSCOPY;  Service: Gastroenterology;  Laterality: N/A;   HEMOSTASIS CLIP PLACEMENT  12/22/2022   Procedure: HEMOSTASIS CLIP PLACEMENT;  Surgeon: Wyline Mood, MD;  Location: [facility] ENDOSCOPY;  Service: Gastroenterology;;   KNEE SURGERY     POLYPECTOMY  12/22/2022   Procedure: POLYPECTOMY;  Surgeon: Wyline Mood, MD;  Location[facility] ENDOSCOPY;  Service:  Gastroenterology;;   SUBMUCOSAL LIFTING INJECTION  12/22/2022   Procedure: SUBMUCOSAL LIFTING INJECTION;  Surgeon: Wyline Mood, MD;  Location: [facility] ENDOSCOPY;  Service: Gastroenterology;;   THROAT SURGERY      Review of Systems:    All systems reviewed and negative except where noted in HPI.   Physical Examination:   There were no vitals taken for this visit.  General: Well-nourished, well-developed in no acute distress.  Lungs: Clear to auscultation  bilaterally. Non-labored. Heart: Regular rate and rhythm, no murmurs rubs or gallops.  Abdomen: Bowel sounds are normal; Abdomen is Soft; No hepatosplenomegaly, masses or hernias;  No Abdominal Tenderness; No guarding or rebound tenderness. Neuro: Alert and oriented x 3.  Grossly intact.  Psych: Alert and cooperative, normal mood and affect.   Imaging Studies: DG Thoracic Spine 2 View Result Date: 07/12/2023 CLINICAL DATA:  Thoracic spine pain for several years, recent worsening EXAM: THORACIC SPINE 2 VIEWS COMPARISON:  02/05/2022 FINDINGS: Frontal and lateral views of the thoracic spine are obtained on 3 images. The alignment is anatomic. No acute fractures. There is mild midthoracic spondylosis unchanged. Paraspinal soft tissues are unremarkable. Visualized portions of the lungs are clear. IMPRESSION: 1. Mild midthoracic spondylosis, stable.  No acute process. Electronically Signed   By: Sharlet Salina M.D.   On: 07/12/2023 18:02    Assessment and Plan:   KOLETON DUCHEMIN is a 53 y.o. y/o male ***    [name], PA-C  Follow up ***  BP check ***

## 2023-09-04 DIAGNOSIS — M5441 Lumbago with sciatica, right side: Secondary | ICD-10-CM | POA: Diagnosis not present

## 2023-09-04 DIAGNOSIS — M5416 Radiculopathy, lumbar region: Secondary | ICD-10-CM | POA: Diagnosis not present

## 2023-09-04 DIAGNOSIS — M25551 Pain in right hip: Secondary | ICD-10-CM | POA: Diagnosis not present

## 2023-09-04 DIAGNOSIS — G8929 Other chronic pain: Secondary | ICD-10-CM | POA: Diagnosis not present

## 2023-09-11 DIAGNOSIS — J3489 Other specified disorders of nose and nasal sinuses: Secondary | ICD-10-CM | POA: Diagnosis not present

## 2023-09-11 DIAGNOSIS — R059 Cough, unspecified: Secondary | ICD-10-CM | POA: Diagnosis not present

## 2023-09-11 DIAGNOSIS — R0981 Nasal congestion: Secondary | ICD-10-CM | POA: Diagnosis not present

## 2023-09-11 DIAGNOSIS — H65192 Other acute nonsuppurative otitis media, left ear: Secondary | ICD-10-CM | POA: Diagnosis not present

## 2023-09-14 ENCOUNTER — Encounter: Payer: Self-pay | Admitting: Family Medicine

## 2023-09-14 ENCOUNTER — Ambulatory Visit: Admitting: Family Medicine

## 2023-09-14 VITALS — BP 143/82 | HR 74 | Resp 16 | Ht 73.0 in | Wt 224.4 lb

## 2023-09-14 DIAGNOSIS — K219 Gastro-esophageal reflux disease without esophagitis: Secondary | ICD-10-CM | POA: Diagnosis not present

## 2023-09-14 DIAGNOSIS — I700 Atherosclerosis of aorta: Secondary | ICD-10-CM

## 2023-09-14 DIAGNOSIS — Z0001 Encounter for general adult medical examination with abnormal findings: Secondary | ICD-10-CM | POA: Diagnosis not present

## 2023-09-14 DIAGNOSIS — M549 Dorsalgia, unspecified: Secondary | ICD-10-CM

## 2023-09-14 DIAGNOSIS — Z13220 Encounter for screening for lipoid disorders: Secondary | ICD-10-CM

## 2023-09-14 DIAGNOSIS — Z79899 Other long term (current) drug therapy: Secondary | ICD-10-CM | POA: Diagnosis not present

## 2023-09-14 DIAGNOSIS — R7309 Other abnormal glucose: Secondary | ICD-10-CM

## 2023-09-14 DIAGNOSIS — K635 Polyp of colon: Secondary | ICD-10-CM

## 2023-09-14 DIAGNOSIS — Z130 Encounter for screening for diseases of the blood and blood-forming organs and certain disorders involving the immune mechanism: Secondary | ICD-10-CM

## 2023-09-14 DIAGNOSIS — L989 Disorder of the skin and subcutaneous tissue, unspecified: Secondary | ICD-10-CM

## 2023-09-14 DIAGNOSIS — Z0000 Encounter for general adult medical examination without abnormal findings: Secondary | ICD-10-CM

## 2023-09-14 DIAGNOSIS — G8929 Other chronic pain: Secondary | ICD-10-CM

## 2023-09-14 NOTE — Progress Notes (Signed)
 Complete physical exam   Patient: Ralph Murphy   DOB: May 28, 1971   53 y.o. Male  MRN: [id number] Visit Date: 09/14/2023  Today's healthcare provider: Carlean Charter, DO   Chief Complaint  Patient presents with   Annual Exam   Subjective    Ralph Murphy is a 53 y.o. male who presents today for a complete physical exam.  He reports consuming a  no-sugar-added  diet. The patient has a physically strenuous job, but has no regular exercise apart from work.  He generally feels fairly well. He reports sleeping poorly. He does not have additional problems to discuss today.   HPI  Ralph Murphy "Ralph Murphy" is a 53 year old male who presents for an annual physical exam.  He was diagnosed with an [facility] infection [facility] on Tuesday (3 days ago) and was prescribed amoxicillin 875 mg twice daily. He notes improvement in symptoms but mentions that the ear was throbbing last night. He suspects the infection started three weeks ago.  He has been experiencing difficulty staying asleep for the past six months. He takes an over-the-counter supplements, called Ageless Max, and notes irritability and nail-biting when not taking it. He has not taken testosterone injections due to a fear of needles.  He follows a sugar-restricted diet after being told he was borderline diabetic last year. He previously consumed 12 to 20 sodas a day but has since cut sugar out completely.  He works for [employer], which involves physical activity such as climbing ladders. He does not engage in additional exercise due to fatigue from long work hours. He experiences shortness of breath when walking upstairs, attributing it to being out of shape. He has not exercised regularly due to fatigue from work.  He has severe back problems attributed to his work history. He was previously prescribed gabapentin, which made him feel 'loopy'. He is not currently taking any muscle relaxers or pain medications like Norco.  He takes psyllium  (Metamucil), Miralax, and Colace for bowel management, reporting daily bowel movements that are 'logish' and soft. He drinks water and Gatorade LIT for hydration. He missed a GI appointment scheduled for February 10th and has not rescheduled. He had 11 polyps noted within his colon last August, with 4 being excised. He experiences abdominal cramping and pain, and has not seen a GI specialist since missing his appointment.  He has a history of smoking from 1984 until August 25, 2018. He reports a family history of cancer, with his father having had lung and colon cancer that metastasized, ultimately leading to death from a stroke or aneurysm.    Past Medical History:  Diagnosis Date   GERD (gastroesophageal reflux disease)    Past Surgical History:  Procedure Laterality Date   COLONOSCOPY WITH PROPOFOL  N/A 12/22/2022   Procedure: COLONOSCOPY WITH PROPOFOL ;  Surgeon: Luke Salaam, MD;  Location: [facility] ENDOSCOPY;  Service: Gastroenterology;  Laterality: N/A;   ESOPHAGOGASTRODUODENOSCOPY (EGD) WITH PROPOFOL  N/A 12/22/2022   Procedure: ESOPHAGOGASTRODUODENOSCOPY (EGD) WITH PROPOFOL ;  Surgeon: Luke Salaam, MD;  Location: [facility] ENDOSCOPY;  Service: Gastroenterology;  Laterality: N/A;   HEMOSTASIS CLIP PLACEMENT  12/22/2022   Procedure: HEMOSTASIS CLIP PLACEMENT;  Surgeon: Luke Salaam, MD;  Location: [facility] ENDOSCOPY;  Service: Gastroenterology;;   KNEE SURGERY     POLYPECTOMY  12/22/2022   Procedure: POLYPECTOMY;  Surgeon: Luke Salaam, MD;  Location: [facility] ENDOSCOPY;  Service: Gastroenterology;;   SUBMUCOSAL LIFTING INJECTION  12/22/2022   Procedure: SUBMUCOSAL LIFTING  INJECTION;  Surgeon: Luke Salaam, MD;  Location: [facility] ENDOSCOPY;  Service: Gastroenterology;;   THROAT SURGERY     Social History   Socioeconomic History   Marital status: Single    Spouse name: Not on file   Number of children: Not on file   Years of education: Not on file   Highest education level: Not on file  Occupational History    Not on file  Tobacco Use   Smoking status: Former    Current packs/day: 0.00    Average packs/day: 1 pack/day for 36.2 years (36.2 ttl pk-yrs)    Types: Cigarettes    Start date: 42    Quit date: 08/25/2018    Years since quitting: 5.0   Smokeless tobacco: Never   Tobacco comments:    Quit smoking ?   Vaping Use   Vaping status: Every Day  Substance and Sexual Activity   Alcohol use: Not Currently    Comment: stopped 10/2017   Drug use: Yes    Types: Marijuana    Comment: daily   Sexual activity: Yes  Other Topics Concern   Not on file  Social History Narrative   Not on file   Social Drivers of Health   Financial Resource Strain: Low Risk  (07/23/2023)   Received from [facility]   Overall Financial Resource Strain (CARDIA)    Difficulty of Paying Living Expenses: Not very hard  Food Insecurity: No Food Insecurity (07/23/2023)   Received from [facility]   Hunger Vital Sign    Worried About Running Out of Food in the Last Year: Never true    Ran Out of Food in the Last Year: Never true  Transportation Needs: No Transportation Needs (07/23/2023)   Received from [facility] - Transportation    In the past 12 months, has lack of transportation kept you from medical appointments or from getting medications?: No    Lack of Transportation (Non-Medical): No  Physical Activity: Not on file  Stress: Not on file  Social Connections: Not on file  Intimate Partner Violence: Not on file   Family Status  Relation Name Status   Father  Deceased  No partnership data on file   Family History  Problem Relation Age of Onset   Colon cancer Father    No Known Allergies  Patient Care Team: Mandalyn Pasqua N, DO as PCP - General (Family Medicine)   Medications: Outpatient Medications Prior to Visit  Medication Sig   [EXPIRED] amoxicillin (AMOXIL) 875 MG tablet Take 875 mg by mouth 2 (two) times daily. 10 days    docusate sodium (COLACE) 100 MG capsule Take 100 mg by mouth 2 (two) times daily.   esomeprazole (NEXIUM) 20 MG capsule Take 20 mg by mouth daily before breakfast.   polyethylene glycol (MIRALAX / GLYCOLAX) 17 g packet Take 17 g by mouth daily.   psyllium (METAMUCIL) 58.6 % packet Take 1 packet by mouth daily.   [DISCONTINUED] Lidocaine , Anorectal, 5 % CREA Apply 1 Application topically 3 (three) times daily.   omeprazole (PRILOSEC) 20 MG capsule Take 20 mg by mouth daily.   [DISCONTINUED] dicyclomine  (BENTYL ) 10 MG capsule Take 1 capsule (10 mg total) by mouth 3 (three) times daily before meals.   [DISCONTINUED] HYDROcodone -acetaminophen  (NORCO/VICODIN) 5-325 MG tablet Take 1 tablet by mouth every 4 (four) hours as needed.   [DISCONTINUED] methocarbamol  (ROBAXIN ) 500 MG tablet Take 1 tablet (500 mg total) by mouth every  6 (six) hours as needed for muscle spasms.   [DISCONTINUED] naproxen  (NAPROSYN ) 500 MG tablet Take 1 tablet (500 mg total) by mouth 2 (two) times daily.   [DISCONTINUED] predniSONE  (DELTASONE ) 10 MG tablet Take 1 tablet (10 mg total) by mouth daily. Day 1-3: take 4 tablets PO daily Day 4-6: take 3 tablets PO daily Day 7-9: take 2 tablets PO daily Day 10-12: take 1 tablet PO daily   [DISCONTINUED] testosterone cypionate (DEPOTESTOSTERONE CYPIONATE) 200 MG/ML injection Inject into the muscle every 14 (fourteen) days.   No facility-administered medications prior to visit.    Review of Systems  Constitutional:  Negative for appetite change, chills, fatigue and fever.  HENT:  Negative for congestion, ear pain, hearing loss, nosebleeds and trouble swallowing.   Eyes:  Negative for pain and visual disturbance.  Respiratory:  Negative for cough, chest tightness and shortness of breath.   Cardiovascular:  Negative for chest pain, palpitations and leg swelling.  Gastrointestinal:  Positive for abdominal pain (cramping) and constipation (controlled on current medications). Negative for  blood in stool, diarrhea, nausea and vomiting.  Endocrine: Negative for polydipsia, polyphagia and polyuria.  Genitourinary:  Negative for dysuria and flank pain.  Musculoskeletal:  Negative for arthralgias, back pain, joint swelling, myalgias and neck stiffness.  Skin:  Negative for color change, rash and wound.  Neurological:  Negative for dizziness, tremors, seizures, speech difficulty, weakness, light-headedness and headaches.  Psychiatric/Behavioral:  Negative for behavioral problems, confusion, decreased concentration, dysphoric mood and sleep disturbance. The patient is not nervous/anxious.   All other systems reviewed and are negative.     Objective    BP (!) 143/82 (BP Location: Left Arm, Patient Position: Sitting, Cuff Size: Normal)   Pulse 74   Resp 16   Ht 6\' 1"  (1.854 m)   Wt 224 lb 6.4 oz (101.8 kg)   SpO2 97%   BMI 29.61 kg/m    Physical Exam Vitals and nursing note reviewed.  Constitutional:      General: He is awake.     Appearance: Normal appearance.  HENT:     Head: Normocephalic and atraumatic.     Right Ear: Tympanic membrane, ear canal and external ear normal.     Left Ear: Tympanic membrane, ear canal and external ear normal.     Nose: Nose normal.     Mouth/Throat:     Mouth: Mucous membranes are moist.     Pharynx: Oropharynx is clear. No oropharyngeal exudate or posterior oropharyngeal erythema.  Eyes:     General: No scleral icterus.    Extraocular Movements: Extraocular movements intact.     Conjunctiva/sclera: Conjunctivae normal.     Pupils: Pupils are equal, round, and reactive to light.  Neck:     Thyroid: No thyromegaly or thyroid tenderness.  Cardiovascular:     Rate and Rhythm: Normal rate and regular rhythm.     Pulses: Normal pulses.     Heart sounds: Normal heart sounds.  Pulmonary:     Effort: Pulmonary effort is normal. No tachypnea, bradypnea or respiratory distress.     Breath sounds: Normal breath sounds. No stridor. No  wheezing, rhonchi or rales.  Abdominal:     General: Bowel sounds are normal. There is no distension.     Palpations: Abdomen is soft. There is no mass.     Tenderness: There is no abdominal tenderness. There is no guarding.     Hernia: No hernia is present.  Musculoskeletal:     Cervical back:  Normal range of motion and neck supple.     Right lower leg: No edema.     Left lower leg: No edema.  Lymphadenopathy:     Cervical: No cervical adenopathy.  Skin:    General: Skin is warm and dry.  Neurological:     Mental Status: He is alert and oriented to person, place, and time. Mental status is at baseline.  Psychiatric:        Mood and Affect: Mood normal.        Behavior: Behavior normal.      Last depression screening scores    09/14/2023    9:21 AM 10/06/2022    1:54 PM  PHQ 2/9 Scores  PHQ - 2 Score 0 0  PHQ- 9 Score  3   Last fall risk screening    09/14/2023    9:21 AM  Fall Risk   Falls in the past year? 0  Number falls in past yr: 0  Injury with Fall? 0  Risk for fall due to : No Fall Risks   Last Audit-C alcohol use screening    10/06/2022    1:54 PM  Alcohol Use Disorder Test (AUDIT)  1. How often do you have a drink containing alcohol? 0  3. How often do you have six or more drinks on one occasion? 0   A score of 3 or more in women, and 4 or more in men indicates increased risk for alcohol abuse, EXCEPT if all of the points are from question 1   No results found for any visits on 09/14/23.  Assessment & Plan    Routine Health Maintenance and Physical Exam  Exercise Activities and Dietary recommendations  Goals   None     Immunization History  Administered Date(s) Administered   Tdap 01/04/2021    Health Maintenance  Topic Date Due   COVID-19 Vaccine (1) 02/27/2024 (Originally 07/03/1975)   Lung Cancer Screening  09/13/2024 (Originally 07/03/2021)   Zoster Vaccines- Shingrix (1 of 2) 09/13/2024 (Originally 07/03/1990)   INFLUENZA VACCINE   12/28/2023   DTaP/Tdap/Td (2 - Td or Tdap) 01/05/2031   Colonoscopy  12/21/2032   Hepatitis C Screening  Completed   HIV Screening  Completed   HPV VACCINES  Aged Out   Meningococcal B Vaccine  Aged Out    Discussed health benefits of physical activity, and encouraged him to engage in regular exercise appropriate for his age and condition.   Annual physical exam  Gastroesophageal reflux disease, unspecified whether esophagitis present -     Vitamin B12  High risk medication use -     Vitamin B12  Skin lesion -     Ambulatory referral to Dermatology  Chronic back pain, unspecified back location, unspecified back pain laterality  Aortic atherosclerosis (HCC)  Polyp of colon, unspecified part of colon, unspecified type  Elevated hemoglobin A1c -     Hemoglobin A1c  Screening for lipid disorders -     Lipid panel  Screening for endocrine, metabolic and immunity disorder -     Comprehensive metabolic panel with GFR    Annual Physical Examination Routine annual physical examination with no specific concerns.  Physical exam overall unremarkable except as noted above. Routine lab work ordered as noted. - Perform routine physical examination. - Review current medications and supplements.  Ear Infection Current ear infection with persistent fluid behind eardrum.  Patient only started antibiotics 3 days ago.  He will need to complete the antibiotics and allow several  weeks to several months for complete resolution of fluid. - Continue amoxicillin as prescribed. - Consider allergy evaluation if symptoms persist without improvement.  Chronic Back Pain Chronic severe back pain related to occupational activities. Gabapentin caused adverse effects. - Consider alternative pain management strategies. - Follow up with pain management specialist if needed.  Insomnia Chronic difficulty staying asleep for the past six months.  Elevated A1c Previously identified as borderline  diabetic, with one previous elevated A1c found in chart. Dietary changes implemented. - Continue current dietary modifications. - Monitor blood glucose levels regularly.  Gastroesophageal Reflux Disease (GERD) Symptoms controlled with current medication regimen. - Continue current medication regimen. - Monitor for symptom recurrence.  Constipation Chronic constipation managed with psyllium, Miralax, and Colace. Regular bowel movements. - Continue current bowel regimen. - Ensure adequate fluid intake.  Colon Polyps Previous removal of four polyps. Missed follow-up with GI. - Reschedule appointment with gastroenterology for follow-up.  Aortic atherosclerosis Continue risk factor modification.  General Health Maintenance Eligible for shingles vaccine and lung cancer screening. Declined shingles vaccine due to fear of needles. Discussed risks of shingles. - Encourage shingles vaccination at a later date. - Encourage lung cancer screening and contact insurance for coverage details.   Follow-up Needs follow-up on GI appointment and potential dermatology referral. - Follow up with gastroenterology for colon polyp surveillance. - Refer to Dr. Avanell Bob at [facility] for dermatology second opinion. - Check blood pressure at home and follow instructions in after visit summary.  Return in about 1 year (around 09/13/2024) for CPE.     I discussed the assessment and treatment plan with the patient  The patient was provided an opportunity to ask questions and all were answered. The patient agreed with the plan and demonstrated an understanding of the instructions.   The patient was advised to call back or seek an in-person evaluation if the symptoms worsen or if the condition fails to improve as anticipated.    Carlean Charter, DO  [facility] [phone] (phone) [phone] (fax)  [facility]

## 2023-09-14 NOTE — Patient Instructions (Signed)

## 2023-09-24 DIAGNOSIS — Z0000 Encounter for general adult medical examination without abnormal findings: Secondary | ICD-10-CM | POA: Insufficient documentation

## 2023-09-24 DIAGNOSIS — K635 Polyp of colon: Secondary | ICD-10-CM | POA: Insufficient documentation

## 2023-09-24 DIAGNOSIS — I700 Atherosclerosis of aorta: Secondary | ICD-10-CM | POA: Insufficient documentation

## 2023-09-24 DIAGNOSIS — G8929 Other chronic pain: Secondary | ICD-10-CM | POA: Insufficient documentation

## 2023-10-15 ENCOUNTER — Telehealth: Payer: Self-pay | Admitting: Family Medicine

## 2023-10-15 NOTE — Telephone Encounter (Signed)
 Received FMLA from [employer] through fax.  Placed in Dr. Louanne Roussel box

## 2023-10-17 DIAGNOSIS — Z0279 Encounter for issue of other medical certificate: Secondary | ICD-10-CM

## 2023-10-17 NOTE — Telephone Encounter (Signed)
 Patient was informed that FMLA paperwork was completed & faxed. A copy will be left upfront for pick up.

## 2024-01-03 ENCOUNTER — Encounter: Payer: Self-pay | Admitting: Emergency Medicine

## 2024-01-03 ENCOUNTER — Emergency Department
Admission: EM | Admit: 2024-01-03 | Discharge: 2024-01-03 | Disposition: A | Discharge status: Home / Self Care | Attending: Emergency Medicine | Admitting: Emergency Medicine

## 2024-01-03 ENCOUNTER — Emergency Department

## 2024-01-03 ENCOUNTER — Other Ambulatory Visit: Payer: Self-pay

## 2024-01-03 DIAGNOSIS — I6523 Occlusion and stenosis of bilateral carotid arteries: Secondary | ICD-10-CM | POA: Diagnosis not present

## 2024-01-03 DIAGNOSIS — R519 Headache, unspecified: Secondary | ICD-10-CM | POA: Diagnosis not present

## 2024-01-03 DIAGNOSIS — R42 Dizziness and giddiness: Secondary | ICD-10-CM | POA: Diagnosis not present

## 2024-01-03 DIAGNOSIS — I672 Cerebral atherosclerosis: Secondary | ICD-10-CM | POA: Diagnosis not present

## 2024-01-03 LAB — COMPREHENSIVE METABOLIC PANEL WITH GFR
ALT: 42 U/L (ref 0–44)
AST: 27 U/L (ref 15–41)
Albumin: 4.4 g/dL (ref 3.5–5.0)
Alkaline Phosphatase: 77 U/L (ref 38–126)
Anion gap: 8 (ref 5–15)
BUN: 14 mg/dL (ref 6–20)
CO2: 27 mmol/L (ref 22–32)
Calcium: 9.4 mg/dL (ref 8.9–10.3)
Chloride: 105 mmol/L (ref 98–111)
Creatinine, Ser: 0.88 mg/dL (ref 0.61–1.24)
GFR, Estimated: >60 mL/min (ref >60)
Glucose, Bld: 99 mg/dL (ref 70–99)
Potassium: 4.4 mmol/L (ref 3.5–5.1)
Sodium: 140 mmol/L (ref 135–145)
Total Bilirubin: 0.9 mg/dL (ref 0.0–1.2)
Total Protein: 7.2 g/dL (ref 6.5–8.1)

## 2024-01-03 LAB — URINALYSIS, ROUTINE W REFLEX MICROSCOPIC
Bilirubin Urine: NEGATIVE
Glucose, UA: NEGATIVE mg/dL
Hgb urine dipstick: NEGATIVE
Ketones, ur: NEGATIVE mg/dL
Leukocytes,Ua: NEGATIVE
Nitrite: NEGATIVE
Protein, ur: NEGATIVE mg/dL
Specific Gravity, Urine: 1.024 (ref 1.005–1.030)
pH: 7 (ref 5.0–8.0)

## 2024-01-03 LAB — CBC
HCT: 47 % (ref 39.0–52.0)
Hemoglobin: 16 g/dL (ref 13.0–17.0)
MCH: 31.6 pg (ref 26.0–34.0)
MCHC: 34 g/dL (ref 30.0–36.0)
MCV: 92.7 fL (ref 80.0–100.0)
Platelets: 218 K/uL (ref 150–400)
RBC: 5.07 MIL/uL (ref 4.22–5.81)
RDW: 12.5 % (ref 11.5–15.5)
WBC: 6.4 K/uL (ref 4.0–10.5)
nRBC: 0 % (ref 0.0–0.2)

## 2024-01-03 MED ORDER — DIPHENHYDRAMINE HCL 50 MG/ML IJ SOLN
25.0000 mg | Freq: Once | INTRAMUSCULAR | Status: AC
  Administered 2024-01-03: 25 mg via INTRAVENOUS
  Filled 2024-01-03: qty 1

## 2024-01-03 MED ORDER — LACTATED RINGERS IV BOLUS
1000.0000 mL | Freq: Once | INTRAVENOUS | Status: AC
  Administered 2024-01-03: 1000 mL via INTRAVENOUS

## 2024-01-03 MED ORDER — BUTALBITAL-APAP-CAFFEINE 50-325-40 MG PO TABS
2.0000 | ORAL_TABLET | Freq: Once | ORAL | Status: AC
  Administered 2024-01-03: 2 via ORAL
  Filled 2024-01-03: qty 2

## 2024-01-03 MED ORDER — IOHEXOL 350 MG/ML SOLN
75.0000 mL | Freq: Once | INTRAVENOUS | Status: AC | PRN
  Administered 2024-01-03: 75 mL via INTRAVENOUS

## 2024-01-03 MED ORDER — MECLIZINE HCL 25 MG PO TABS: 25.0000 mg | ORAL_TABLET | Freq: Three times a day (TID) | ORAL | 0 refills | Status: AC | PRN

## 2024-01-03 MED ORDER — DIAZEPAM 5 MG/ML IJ SOLN
5.0000 mg | Freq: Once | INTRAMUSCULAR | Status: AC
  Administered 2024-01-03: 5 mg via INTRAVENOUS
  Filled 2024-01-03: qty 2

## 2024-01-03 MED ORDER — PROCHLORPERAZINE EDISYLATE 10 MG/2ML IJ SOLN
10.0000 mg | Freq: Once | INTRAMUSCULAR | Status: AC
  Administered 2024-01-03: 10 mg via INTRAVENOUS
  Filled 2024-01-03: qty 2

## 2024-01-03 NOTE — ED Triage Notes (Addendum)
 Pt via POV from home. Pt c/o dizziness and headache that started yesterday, reports some palpitations. States when I turn my eyes either way I get dizzy and I can feel my heart is racing. Denies any pain. Denies SOB. Denies cardiac hx. Pt is A&OX4 and NAD, ambulatory to triage with a steady gait.

## 2024-01-03 NOTE — ED Provider Notes (Addendum)
 [facility] Provider Note    Event Date/Time   First MD Initiated Contact with Patient 01/03/24 1146     (approximate)   History   Dizziness   HPI  Ralph Murphy is a 53 y.o. male who presents to the ED for evaluation of Dizziness   Review of an annual PCP visit from April.  GERD, minimal medical history.  Patient presents with his wife for evaluation of new vertigo, headache.  Symptoms just in the past 2 days, no discrete trauma with symptoms started shortly after he twisted his head to the side and he felt a popping sensation in his neck.  He is not reporting severe vertigo whenever he moves his eyes horizontally towards the right.  Associated severe headache over the past couple days, he has no history of headaches or vertigo.  No presyncope, falls or syncope.  No gait changes or changes to the dexterity or sensation of his hands or feet.  No difficulty swallowing   Physical Exam   Triage Vital Signs: ED Triage Vitals  Encounter Vitals Group     BP 01/03/24 1038 (!) 133/92     Girls Systolic BP Percentile --      Girls Diastolic BP Percentile --      Boys Systolic BP Percentile --      Boys Diastolic BP Percentile --      Pulse Rate 01/03/24 1038 77     Resp 01/03/24 1038 17     Temp 01/03/24 1038 98.4 F (36.9 C)     Temp Source 01/03/24 1038 Oral     SpO2 01/03/24 1038 99 %     Weight 01/03/24 1038 220 lb (99.8 kg)     Height 01/03/24 1038 6' 1 (1.854 m)     Head Circumference --      Peak Flow --      Pain Score 01/03/24 1057 8     Pain Loc --      Pain Education --      Exclude from Growth Chart --     Most recent vital signs: Vitals:   01/03/24 1154 01/03/24 1510  BP:  136/67  Pulse:  (!) 57  Resp:  16  Temp:  98.3 F (36.8 C)  SpO2: 99% 99%    General: Awake, no distress.  Laying prone beneath a blanket when I first entered the room, pleasant and conversational.  Is able to sit upright on the edge of the bed when I ask  him to to finish an exam he reports severe vertigo with this position change, he is leaning towards the left when sitting upright. CV:  Good peripheral perfusion.  Resp:  Normal effort.  Abd:  No distention.  MSK:  No deformity noted.  Neuro:  No focal deficits appreciated. Cranial nerves II through XII intact 5/5 strength and sensation in all 4 extremities Does not tolerate EOM horizontally to the right long enough for me to even establish nystagmus or not. Vertical up/down and EOM to the left without nystagmus or symptoms Other:     ED Results / Procedures / Treatments   Labs (all labs ordered are listed, but only abnormal results are displayed) Labs Reviewed  URINALYSIS, ROUTINE W REFLEX MICROSCOPIC - Abnormal; Notable for the following components:      Result Value   Color, Urine YELLOW (*)    APPearance CLEAR (*)    All other components within normal limits  COMPREHENSIVE METABOLIC PANEL WITH GFR  CBC  CBG MONITORING, ED    EKG Sinus rhythm with a rate of 71 bpm.  Leftward axis, left bundle no STEMI by Sgarbossa criteria. Only comparison, from 2019, did not have this left bundle.  RADIOLOGY CTA neck/head interpreted by me without evidence of acute pathology.  Official radiology report(s): CT ANGIO HEAD NECK W WO CM Result Date: 01/03/2024 CLINICAL DATA:  53 year old male with headache and dizziness, palpitations. EXAM: CT ANGIOGRAPHY HEAD AND NECK WITH AND WITHOUT CONTRAST TECHNIQUE: Multidetector CT imaging of the head and neck was performed using the standard protocol during bolus administration of intravenous contrast. Multiplanar CT image reconstructions and MIPs were obtained to evaluate the vascular anatomy. Carotid stenosis measurements (when applicable) are obtained utilizing NASCET criteria, using the distal internal carotid diameter as the denominator. RADIATION DOSE REDUCTION: This exam was performed according to the departmental dose-optimization program which  includes automated exposure control, adjustment of the mA and/or kV according to patient size and/or use of iterative reconstruction technique. CONTRAST:  75mL OMNIPAQUE  IOHEXOL  350 MG/ML SOLN COMPARISON:  Head CT 12/05/2006 [facility] FINDINGS: CT HEAD Brain: Cerebral volume remains normal. No midline shift, ventriculomegaly, mass effect, evidence of mass lesion, intracranial hemorrhage or evidence of cortically based acute infarction. Gray-white matter differentiation is within normal limits throughout the brain. Calvarium and skull base: Intact. Paranasal sinuses: Tympanic cavities, Visualized paranasal sinuses and mastoids are clear. Orbits: Visualized orbits and scalp soft tissues are within normal limits. CTA NECK Skeleton: Intact, negative. Upper chest: Negative; symmetric mild dependent atelectasis. Other neck: Nonvascular neck soft tissue spaces appear negative. Aortic arch: Normal 3 vessel arch. Right carotid system: Normal brachiocephalic artery and right CCA. Right carotid bifurcation soft more so than calcified plaque, no stenosis. Left carotid system: Negative left CCA. Negative left carotid bifurcation. Mild soft plaque at the distal left ICA bulb best seen on series 13, image 151. No stenosis. Vertebral arteries: Proximal right subclavian artery and right vertebral artery origin are normal. Mildly non dominant right vertebral artery appears patent and normal to the skull base. Proximal left subclavian artery and left vertebral artery origin are normal. Mildly dominant left vertebral artery appears patent and normal to the skull base. CTA HEAD Posterior circulation: Fairly codominant distal vertebral arteries, vertebrobasilar junction are patent. Fenestrated vertebrobasilar junction, normal variant. Normal left PICA origin. Dominant appearing right AICA is patent. Patent basilar artery without stenosis. Normal SCA and PCA origins. Posterior communicating arteries are diminutive or absent.  Bilateral PCA branches are within normal limits. Anterior circulation: Both ICA siphons are patent with minimal plaque and no stenosis. Patent carotid termini. Normal MCA and ACA origins. Dominant right ACA A1. Diminutive or absent anterior communicating artery. And azygous type ACA A2 anatomy (series 14, image 23). Bilateral ACA branches are within normal limits. Left MCA M1 segment and bifurcation are patent without stenosis. Right MCA M1 segment and bifurcation are patent without stenosis. Bilateral MCA branches are normal. Venous sinuses: Patent. Anatomic variants: Mildly dominant left vertebral artery. Fenestrated vertebrobasilar junction. Dominant right ACA A1 segment. Azygous ACA A2 anatomy. Review of the MIP images confirms the above findings IMPRESSION: 1. Mild for age atherosclerosis on CTA Head and Neck. No arterial stenosis or occlusion. 2. Normal CT appearance of the brain. Electronically Signed   By: VEAR Hurst M.D.   On: 01/03/2024 14:18    PROCEDURES and INTERVENTIONS:  Procedures  Medications  lactated ringers  bolus 1,000 mL (1,000 mLs Intravenous New Bag/Given 01/03/24 1310)  diazepam  (VALIUM ) injection 5 mg (5  mg Intravenous Given 01/03/24 1308)  butalbital -acetaminophen -caffeine  (FIORICET ) 50-325-40 MG per tablet 2 tablet (2 tablets Oral Given 01/03/24 1318)  prochlorperazine  (COMPAZINE ) injection 10 mg (10 mg Intravenous Given 01/03/24 1305)  diphenhydrAMINE  (BENADRYL ) injection 25 mg (25 mg Intravenous Given 01/03/24 1304)  iohexol  (OMNIPAQUE ) 350 MG/ML injection 75 mL (75 mLs Intravenous Contrast Given 01/03/24 1345)     IMPRESSION / MDM / ASSESSMENT AND PLAN / ED COURSE  I reviewed the triage vital signs and the nursing notes.  Differential diagnosis includes, but is not limited to, carotid dissection, ICH, BPPV, complex migraine  {Patient presents with symptoms of an acute illness or injury that is potentially life-threatening.  Patient presents with headache and vertigo, resolving  in the ED and with a benign workup, ultimately suitable for outpatient management.   Significant symptoms with lateral EOM movement, otherwise no evidence of neurologic deficits.  No signs of trauma.  Normal CBC, metabolic panel and UA.  CTA neck/head without acute pathology.  Symptoms resolved with above medications.  He is back to baseline and suitable for outpatient management.  Clinical Course as of 01/03/24 1553  Thu Jan 03, 2024  1438 Reassessed, starting to feel better. [DS]  1547 Reassessed, patient reports feeling well.  Disconnecting from the monitor, he stands up independently and says all of his symptoms have resolved and he is appreciative.  We discussed possible etiologies of symptoms.  We discussed meclizine  as needed at home.  We discussed return precautions. [DS]    Clinical Course User Index [DS] Claudene Rover, MD     FINAL CLINICAL IMPRESSION(S) / ED DIAGNOSES   Final diagnoses:  Dizziness  Vertigo  Bad headache     Rx / DC Orders   ED Discharge Orders          Ordered    meclizine  (ANTIVERT ) 25 MG tablet  3 times daily PRN        01/03/24 1548             Note:  This document was prepared using Dragon voice recognition software and may include unintentional dictation errors.   Claudene Rover, MD 01/03/24 1553    Claudene Rover, MD 01/03/24 [phone]

## 2024-01-03 NOTE — Discharge Instructions (Addendum)
 You can try meclizine  as needed in the future if you have further spells of vertigo/dizziness.  This is a safe medicine, over-the-counter Dramamine is the same thing.  If you have any worsening symptoms or other concerns then please return to the ED

## 2024-01-04 ENCOUNTER — Ambulatory Visit: Admitting: Family Medicine

## 2024-03-27 ENCOUNTER — Other Ambulatory Visit: Payer: Self-pay | Admitting: Medical Genetics

## 2024-03-27 DIAGNOSIS — Z006 Encounter for examination for normal comparison and control in clinical research program: Secondary | ICD-10-CM

## 2024-05-14 ENCOUNTER — Ambulatory Visit: Payer: Self-pay

## 2024-05-14 NOTE — Telephone Encounter (Signed)
°  FYI Only or Action Required?: FYI only for provider: appointment scheduled on 12.18.25.  Patient was last seen in primary care on 09/14/2023 by Donzella Lauraine SAILOR, DO.  Called Nurse Triage reporting No chief complaint on file..  Symptoms began several days ago.  Interventions attempted: OTC medications: Tylenol  and Dayquil.  Symptoms are: stable.  Triage Disposition: No disposition on file.  Patient/caregiver understands and will follow disposition?: Message from [place] T sent at 05/14/2024  1:33 PM EST  Reason for Triage: Body aches, had fever, no energy, headache, did have diarrhea, scratchy throat- [id number]   Reason for Disposition  [1] MODERATE pain (e.g., interferes with normal activities) AND [2] present > 3 days  Answer Assessment - Initial Assessment Questions 1. ONSET: When did the muscle aches or body pains start?   Sunday       2. LOCATION: What part of your body is hurting? (e.g., entire body, arms, legs)       Generalized with bilateral knee pain  3. SEVERITY: How bad is the pain? (Scale 1-10; or mild, moderate, severe)      8/10  4. CAUSE: What do you think is causing the pains?      Pt unsure of cause  5. FEVER: Do you have a fever? If Yes, ask: What is your temperature, how was it measured, and  when did it start?       Denies. States his fever broke on Monday  6. OTHER SYMPTOMS: Pt denies chest pain, cold or flu symptoms, rash,  weight loss       Pt reports headache 6/10, lack of energy, lack of appetite, and scratchy throat. Pain on back of neck. Pt reports left eye blurred vision with discharge  Pt reports lack of energy, body aches, fever onset 3 days Pt is taking OTC Tyenol and Dayquil Pt scheduled for a visit on  12.18.25 for further evaluation. Pt agrees with plan of care, will call back for any worsening symptoms Pt informed of 3g max of acetaminophen   Protocols used: Muscle Aches and Body Pain-A-AH

## 2024-05-14 NOTE — Telephone Encounter (Signed)
 Attempt made at 2:11. Phone rings one time then silence. Unable to leave message.

## 2024-05-15 ENCOUNTER — Ambulatory Visit: Admitting: Nurse Practitioner

## 2024-06-17 ENCOUNTER — Ambulatory Visit: Payer: Self-pay

## 2024-06-17 NOTE — Telephone Encounter (Signed)
 FYI Only or Action Required?: FYI only for provider: appointment scheduled on 06/18/24.  Patient was last seen in primary care on 09/14/2023 by Donzella Lauraine SAILOR, DO.  Called Nurse Triage reporting Otalgia.  Symptoms began 2 months ago.  Interventions attempted: Prescription medications: found Amoxicillin  last week and took the rest of the abx.  Symptoms are: gradually worsening.  Triage Disposition: See Physician Within 24 Hours  Patient/caregiver understands and will follow disposition?: Yes        Message from [facility] E sent at 06/17/2024  3:11 PM EST  Reason for Triage: Worsening ear infection symptoms. Already seen, ear discomfort so bad that his eyes are watering.   Reason for Disposition  White, yellow, or green discharge (pus)  Answer Assessment - Initial Assessment Questions 1. LOCATION: Which ear is involved?     Left ear  2. ONSET: When did the ear pain start?      2 months  3. SEVERITY: How bad is the pain?  (Scale 1-10; mild, moderate or severe)     8/10  4. URI SYMPTOMS: Do you have a runny nose or cough?     Runny, cough with phlegm grey thick chunky 5. FEVER: Do you have a fever? If Yes, ask: What is your temperature, how was it measured, and when did it start?     No  6. CAUSE: Have you been swimming recently?, How often do you use Q-TIPS?, Have you had any recent air travel or scuba diving?     no 7. OTHER SYMPTOMS: Do you have any other symptoms? (e.g., decreased hearing, dizziness, headache, stiff neck, vomiting)     Having pain  warm to temple area and scalp area is sore to touch  Redness to temple area,  left eye drainage (yellow), left eye sensitive to sun (5 days ago ) says eyeball sore to touch - looks like eyelid drooping  Protocols used: [place]

## 2024-06-18 ENCOUNTER — Ambulatory Visit: Admitting: Family Medicine

## 2024-06-18 ENCOUNTER — Encounter: Payer: Self-pay | Admitting: Family Medicine

## 2024-06-18 VITALS — BP 132/78 | HR 78 | Resp 16 | Ht 73.0 in | Wt 234.0 lb

## 2024-06-18 DIAGNOSIS — J0100 Acute maxillary sinusitis, unspecified: Secondary | ICD-10-CM | POA: Diagnosis not present

## 2024-06-18 DIAGNOSIS — H9202 Otalgia, left ear: Secondary | ICD-10-CM | POA: Diagnosis not present

## 2024-06-18 DIAGNOSIS — H60502 Unspecified acute noninfective otitis externa, left ear: Secondary | ICD-10-CM | POA: Diagnosis not present

## 2024-06-18 MED ORDER — AMOXICILLIN-POT CLAVULANATE 875-125 MG PO TABS: 1.0000 | ORAL_TABLET | Freq: Two times a day (BID) | ORAL | 0 refills | Status: AC

## 2024-06-18 MED ORDER — CIPROFLOXACIN-DEXAMETHASONE 0.3-0.1 % OT SUSP: 4.0000 [drp] | Freq: Two times a day (BID) | OTIC | 0 refills | Status: AC

## 2024-06-18 NOTE — Progress Notes (Signed)
 "  Acute Office Visit  Subjective:     Patient ID: Ralph Murphy, male    DOB: 01-27-1971, 54 y.o.   MRN: [id number]  Chief Complaint  Patient presents with   Ear Pain    Left. Has been coming/going x6 months. Last 5-7 days has been worse    HPI Patient is in today for complaints of left ear pain. He is a new patient to me. He voices left ear pain has been intermittent for the last 6 months, but pain worsened in the last 5 to 7 days. He describes pain to be constant for the last 5 to 7 days. He has taken OTC Advil and Tylenol  and voices it does not help. He has had headaches for the last 5 to 7 days associated with ear pain. He voices one episode of dizziness last Thursday causing him to miss work. He voices vertigo/dizziness was so bad that he could not stand upright.   He voices he always has sinus congestion. He does endorse associated sinus pain, left sided. He voices he used OTC Mucinex nasal spray in the past and did not tolerate it well.   Review of Systems  Constitutional:  Negative for fever.  HENT:  Positive for congestion, ear pain and sinus pain. Negative for ear discharge.   Neurological:  Positive for dizziness and headaches.        Objective:    BP 132/78   Pulse 78   Resp 16   Ht 6' 1 (1.854 m)   Wt 234 lb (106.1 kg)   SpO2 98%   BMI 30.87 kg/m      Physical Exam Constitutional:      Appearance: Normal appearance. He is not ill-appearing or toxic-appearing.  HENT:     Right Ear: Tympanic membrane and ear canal normal. No drainage. No middle ear effusion. Tympanic membrane is not erythematous, retracted or bulging.     Left Ear: Tympanic membrane normal. No drainage.  No middle ear effusion. Tympanic membrane is not erythematous, retracted or bulging.     Ears:     Comments: Left ear canal edematous with mild erythema . Left external ear tender to palpation.  Cardiovascular:     Rate and Rhythm: Normal rate and regular rhythm.     Heart sounds: Normal  heart sounds.  Pulmonary:     Effort: Pulmonary effort is normal. No respiratory distress.     Breath sounds: Normal breath sounds. No wheezing, rhonchi or rales.  Skin:    General: Skin is warm and dry.  Neurological:     General: No focal deficit present.     Mental Status: He is alert.  Psychiatric:        Mood and Affect: Mood normal.        Behavior: Behavior normal.          Assessment & Plan:   Assessment & Plan Acute otitis externa of left ear, unspecified type Left ear pain consistent with acute otitis externa. See HPI and physical exam for details.  -Start Ciprodex  4 drops BID into the left ear for 7 days -Return precautions advised includes presentation of fever and/or if symptoms fail to resolve -Red flag/ ED symptoms advised includes severe headache with or without fever Orders:   ciprofloxacin -dexamethasone  (CIPRODEX ) OTIC suspension; Place 4 drops into the left ear 2 (two) times daily. 4 drops into the left ear twice a day for 7 days.  Acute non-recurrent maxillary sinusitis Maxillary sinus tenderness bilaterally during  exam. Presentation and symptoms consistent with acute non-recurrent maxillary sinusitis  -Start Augmentin  875-125mg  BID x 7 days -Recommended use of OTC Flonase daily for relief of sinus congestion  Orders:   amoxicillin -clavulanate (AUGMENTIN ) 875-125 MG tablet; Take 1 tablet by mouth 2 (two) times daily. Take medication with food  Left ear pain Left ear pain, see HPI for details.  -Left ear pain consistent with otitis externa, see above.  Orders:   amoxicillin -clavulanate (AUGMENTIN ) 875-125 MG tablet; Take 1 tablet by mouth 2 (two) times daily. Take medication with food   ciprofloxacin -dexamethasone  (CIPRODEX ) OTIC suspension; Place 4 drops into the left ear 2 (two) times daily. 4 drops into the left ear twice a day for 7 days.     Return if symptoms worsen or fail to improve.  LAYMON LOISE CORE, FNP  "
